# Patient Record
Sex: Female | Born: 1965 | Race: White | Hispanic: No | Marital: Married | Smoking: Never smoker
Health system: Southern US, Community
[De-identification: ages and names within clinical notes are randomized; demographics above are authoritative.]

## PROBLEM LIST (undated history)

## (undated) DIAGNOSIS — R638 Other symptoms and signs concerning food and fluid intake: Secondary | ICD-10-CM

## (undated) DIAGNOSIS — Z87440 Personal history of urinary (tract) infections: Secondary | ICD-10-CM

## (undated) DIAGNOSIS — F329 Major depressive disorder, single episode, unspecified: Secondary | ICD-10-CM

## (undated) DIAGNOSIS — F32A Depression, unspecified: Secondary | ICD-10-CM

## (undated) DIAGNOSIS — N951 Menopausal and female climacteric states: Secondary | ICD-10-CM

## (undated) HISTORY — DX: Other symptoms and signs concerning food and fluid intake: R63.8

## (undated) HISTORY — DX: Personal history of urinary (tract) infections: Z87.440

## (undated) HISTORY — DX: Major depressive disorder, single episode, unspecified: F32.9

## (undated) HISTORY — DX: Depression, unspecified: F32.A

## (undated) HISTORY — DX: Menopausal and female climacteric states: N95.1

---

## 1990-02-10 HISTORY — PX: DILATION AND CURETTAGE OF UTERUS: SHX78

## 1996-02-11 HISTORY — PX: TUBAL LIGATION: SHX77

## 2004-10-16 ENCOUNTER — Ambulatory Visit: Payer: Self-pay | Admitting: Obstetrics and Gynecology

## 2005-10-20 ENCOUNTER — Ambulatory Visit: Payer: Self-pay | Admitting: Obstetrics and Gynecology

## 2006-10-26 ENCOUNTER — Ambulatory Visit: Payer: Self-pay | Admitting: Obstetrics and Gynecology

## 2009-03-29 ENCOUNTER — Ambulatory Visit: Payer: Self-pay | Admitting: Obstetrics and Gynecology

## 2012-06-10 LAB — HM MAMMOGRAPHY

## 2012-06-17 ENCOUNTER — Ambulatory Visit: Payer: Self-pay | Admitting: Obstetrics and Gynecology

## 2013-05-17 LAB — HM COLONOSCOPY

## 2013-05-17 LAB — HM PAP SMEAR: HM Pap smear: NEGATIVE

## 2014-07-26 ENCOUNTER — Encounter: Payer: Self-pay | Admitting: Obstetrics and Gynecology

## 2014-09-26 ENCOUNTER — Encounter: Payer: Self-pay | Admitting: Obstetrics and Gynecology

## 2014-10-26 ENCOUNTER — Other Ambulatory Visit: Payer: Self-pay

## 2014-10-26 MED ORDER — FLUOXETINE HCL 20 MG PO CAPS: 20.0000 mg | ORAL_CAPSULE | Freq: Every day | ORAL | Status: DC

## 2014-11-15 ENCOUNTER — Other Ambulatory Visit: Payer: BLUE CROSS/BLUE SHIELD

## 2014-11-16 ENCOUNTER — Encounter: Payer: Self-pay | Admitting: Obstetrics and Gynecology

## 2014-11-30 ENCOUNTER — Encounter: Payer: Self-pay | Admitting: Obstetrics and Gynecology

## 2014-12-07 ENCOUNTER — Encounter: Payer: Self-pay | Admitting: Obstetrics and Gynecology

## 2014-12-07 ENCOUNTER — Ambulatory Visit (INDEPENDENT_AMBULATORY_CARE_PROVIDER_SITE_OTHER): Payer: BLUE CROSS/BLUE SHIELD | Admitting: Obstetrics and Gynecology

## 2014-12-07 VITALS — BP 112/80 | HR 77 | Ht 66.0 in | Wt 178.3 lb

## 2014-12-07 DIAGNOSIS — E663 Overweight: Secondary | ICD-10-CM | POA: Diagnosis not present

## 2014-12-07 DIAGNOSIS — F329 Major depressive disorder, single episode, unspecified: Secondary | ICD-10-CM | POA: Diagnosis not present

## 2014-12-07 DIAGNOSIS — F3281 Premenstrual dysphoric disorder: Secondary | ICD-10-CM | POA: Diagnosis not present

## 2014-12-07 DIAGNOSIS — F32A Depression, unspecified: Secondary | ICD-10-CM | POA: Insufficient documentation

## 2014-12-07 DIAGNOSIS — Z01419 Encounter for gynecological examination (general) (routine) without abnormal findings: Secondary | ICD-10-CM | POA: Diagnosis not present

## 2014-12-07 DIAGNOSIS — N951 Menopausal and female climacteric states: Secondary | ICD-10-CM | POA: Diagnosis not present

## 2014-12-07 DIAGNOSIS — Z1211 Encounter for screening for malignant neoplasm of colon: Secondary | ICD-10-CM

## 2014-12-07 HISTORY — DX: Premenstrual dysphoric disorder: F32.81

## 2014-12-07 MED ORDER — FLUOXETINE HCL 20 MG PO CAPS: 20.0000 mg | ORAL_CAPSULE | Freq: Every day | ORAL | Status: DC

## 2014-12-07 NOTE — Addendum Note (Signed)
Addended by: Jackquline DenmarkIDGEWAY, Shadana Pry W on: 12/07/2014 12:50 PM   Modules accepted: Orders

## 2014-12-07 NOTE — Progress Notes (Signed)
Patient ID: Michelle Dunlap, female   DOB: 14-Feb-1965, 49 y.o.   MRN: [id number] ANNUAL PREVENTATIVE CARE GYN  ENCOUNTER NOTE  Subjective:       Michelle Dunlap is a 49 y.o. No obstetric history on file. female here for a routine annual gynecologic exam.  Current complaints: 1.  Annual exam 2.  Mild hot flashes/night sweats 3.  Occasionally 2 periods/month, otherwise regular cycles, 4-5 days of bleeding 4.  Refill of fluoxetine - current dosage well controlling anxiety/depression, no side effects   Gynecologic History Patient's last menstrual period was 11/14/2014 (approximate). Contraception: BTL Last Pap: 05/17/13 neg/neg. Last mammogram: 06/10/2012 [facility] 1 2 children Monogamous relationship, married. No hx of STDs, abnormal pap smears  Obstetric History OB History  No data available    Past Medical History  Diagnosis Date  . History of UTI   . Depression     03/2000- stable on meds- 01/2014- fluoxetine rx effective  qd  . Perimenopausal   . Increased BMI     Past Surgical History  Procedure Laterality Date  . Tubal ligation  1998  . Dilation and curettage of uterus  1992    missed ab    Current Outpatient Prescriptions on File Prior to Visit  Medication Sig Dispense Refill  . Ascorbic Acid (VITAMIN C) 100 MG tablet Take 100 mg by mouth daily.    . calcium carbonate (OS-CAL) 600 MG TABS tablet Take 600 mg by mouth 2 (two) times daily with a meal.    . cholecalciferol (VITAMIN D) 1000 UNITS tablet Take 1,000 Units by mouth daily.    Marland Kitchen FLUoxetine (PROZAC) 20 MG capsule Take 1 capsule (20 mg total) by mouth daily. 90 capsule 0  . thiamine (VITAMIN B-1) 100 MG tablet Take 100 mg by mouth daily.     No current facility-administered medications on file prior to visit.    No Known Allergies  Social History   Social History  . Marital Status: Married    Spouse Name: N/A  . Number of Children: N/A  . Years of Education: N/A   Occupational History  . Not on file.    Social History Main Topics  . Smoking status: Never Smoker   . Smokeless tobacco: Not on file  . Alcohol Use: No  . Drug Use: No  . Sexual Activity: Yes    Birth Control/ Protection: Surgical   Other Topics Concern  . Not on file   Social History Narrative    Family History  Problem Relation Age of Onset  . Mental retardation Sister     passed away 29  . Cancer Neg Hx   . Diabetes Neg Hx   . Heart disease Neg Hx     The following portions of the patient's history were reviewed and updated as appropriate: allergies, current medications, past family history, past medical history, past social history, past surgical history and problem list.  Review of Systems ROS Review of Systems - General ROS: negative for - chills, fatigue, fever, weight gain or weight loss. Positive for hot flashes, night sweats Psychological ROS: negative for - anxiety, decreased libido, depression, mood swings Ophthalmic ROS: negative for - blurry vision, eye pain or loss of vision ENT ROS: negative for - headaches, visual changes Hematological and Lymphatic ROS: negative for - bleeding problems, bruising, swollen lymph nodes or weight loss Endocrine ROS: negative for - galactorrhea, hair pattern changes, hot flashes, malaise/lethargy Breast ROS: negative for - new or changing breast lumps or  nipple discharge Respiratory ROS: negative for - cough or shortness of breath Cardiovascular ROS: negative for - chest pain, irregular heartbeat, palpitations or shortness of breath Gastrointestinal ROS: no abdominal pain, change in bowel habits, or black or bloody stools Genito-Urinary ROS: no dysuria, trouble voiding, or hematuria Neurological ROS: negative for - bowel and bladder control changes Dermatological ROS: negative for rash and skin lesion changes   Objective:   BP 112/80 mmHg  Pulse 77  Ht 5\' 6"  (1.676 m)  Wt 178 lb 5 oz (80.882 kg)  BMI 28.79 kg/m2  LMP 11/14/2014  (Approximate) CONSTITUTIONAL: Well-developed, well-nourished female in no acute distress.  PSYCHIATRIC: Normal mood and affect. Normal behavior. Normal judgment and thought content. NEUROLGIC: Alert and oriented to person, place, and time. Normal muscle tone coordination. No cranial nerve deficit noted. HENT:  Normocephalic, atraumatic EYES: Conjunctivae and EOM are normal.  NECK: Normal range of motion, supple, no masses.  Normal thyroid.  SKIN: Skin is warm and dry. No rash noted. Scattered nevi, cherry angiomas. CARDIOVASCULAR: Normal heart rate noted, regular rhythm, no murmur. RESPIRATORY: Clear to auscultation bilaterally. BREASTS: Symmetric in size. No masses, skin changes, nipple drainage, or lymphadenopathy. ABDOMEN: Soft, normal bowel sounds, no distention noted.  No tenderness, rebound or guarding.  BLADDER: Normal PELVIC:  External Genitalia: Normal  BUS: Normal  Vagina: Normal  Cervix: Ectropian cervix, mild mucous discharge  Uterus: Normal size, mobile, Midplane, nontender  Adnexa: Normal  RV: External Exam NormaI, No Rectal Masses and Normal Sphincter tone  MUSCULOSKELETAL: Normal range of motion. No tenderness.  No cyanosis, clubbing, or edema.  2+ distal pulses. LYMPHATIC: No Axillary, Supraclavicular, or Inguinal Adenopathy.    Assessment:   Annual gynecologic examination 49 y.o. Contraception: BTL BMI: 28.8   (BMI:29.1 in 2015) Colon cancer screening Perimenopausal Vasomotor symptoms, Mild PMDD/Depression - well controlled On Prozac; Desires to continue   Plan:  Pap: not needed until 2018 Mammogram: ORDERED Stool Guaiac Testing:  ORDERED.  Epi Pro Colon test ordered for colon cancer screening as patient declined colonoscopy Labs: DECLINES - seeing new PCP this week Routine preventative health maintenance measures emphasized: Exercise/Diet/Weight control, Tobacco Warnings and Alcohol/Substance use risks.  Continue vitamin D/calcium for bone health Refill  fluoxetine for vasomotor symptoms, depression, anxiety. Patient well controlled on current dose.  Return to Clinic - 1 Year   Fenton MallingDebbie Ridgeway, LPN Doreene NestElena Klaus, PA-S Herold HarmsMartin A Michaelle Bottomley, MD    I have seen, interviewed, and examined the patient in conjunction with the [employer] and affirm the diagnosis and management plan. Makenze Ellett A. Jarrod Bodkins, MD, FACOG   Note: This dictation was prepared with Dragon dictation along with smaller phrase technology. Any transcriptional errors that result from this process are unintentional.

## 2014-12-07 NOTE — Patient Instructions (Signed)
1. No pap smear needed until 2018 2. Mammogram ordered to be done this year 3. Stool guaiac cards given today for colon cancer screening 4. Epi Pro Colon blood test ordered for colon cancer screening 5. Routine preventantive health maintenance measures emphasized including: exercise/diet, weight control, tobacco avoidance, alcohol/substance abuse avoidance 6. Continue vitamin D and calcium supplements for bone health maintenance 7. Continue Prozac at current dose 8. Return to clinic in 1 year for routine gynecologic follow up

## 2014-12-19 LAB — EPI PROCOLON(R), SEPTIN 9

## 2015-12-11 ENCOUNTER — Encounter: Payer: BLUE CROSS/BLUE SHIELD | Admitting: Obstetrics and Gynecology

## 2015-12-23 ENCOUNTER — Other Ambulatory Visit: Payer: Self-pay | Admitting: Obstetrics and Gynecology

## 2016-01-22 ENCOUNTER — Other Ambulatory Visit: Payer: Self-pay

## 2016-02-06 ENCOUNTER — Other Ambulatory Visit: Payer: Self-pay | Admitting: Obstetrics and Gynecology

## 2016-02-14 ENCOUNTER — Other Ambulatory Visit: Payer: Self-pay | Admitting: Obstetrics and Gynecology

## 2016-03-14 NOTE — Progress Notes (Signed)
Patient ID: Michelle Dunlap, female   DOB: 1965-07-28, 51 y.o.   MRN: [id number] ANNUAL PREVENTATIVE CARE GYN  ENCOUNTER NOTE  Subjective:       Michelle Dunlap is a 51 y.o. G3 P81  female here for a routine annual gynecologic exam.  Current complaints: 1.  Annual exam 2.  Refill of fluoxetine - wants to increase dosage  no side effects  Patient reports increased stressors; father passed away early 02-03-2018and son is getting married this year. She temporarily increased her fluoxetine dose around the time of her father's passing on. She is not having any significant side effects with the medication.  Patient has gradually lost weight over the year through healthy eating and exercise   Gynecologic History No LMP recorded. Contraception: BTL Last Pap: 05/17/13 neg/neg. Last mammogram: 06/10/2012 [facility] 1 2 children Monogamous relationship, married. No hx of STDs, abnormal pap smears  Obstetric History OB History  No data available    Past Medical History:  Diagnosis Date  . Depression    03/2000- stable on meds- 01/2014- fluoxetine rx effective 20mg  qd  . History of UTI   . Increased BMI   . Perimenopausal     Past Surgical History:  Procedure Laterality Date  . DILATION AND CURETTAGE OF UTERUS  1992   missed ab  . TUBAL LIGATION  1998    Current Outpatient Prescriptions on File Prior to Visit  Medication Sig Dispense Refill  . Ascorbic Acid (VITAMIN C) 100 MG tablet Take 100 mg by mouth daily.    . Biotin 10 MG CAPS Take by mouth daily.    . calcium carbonate (OS-CAL) 600 MG TABS tablet Take 600 mg by mouth 2 (two) times daily with a meal.    . cholecalciferol (VITAMIN D) 1000 UNITS tablet Take 1,000 Units by mouth daily.    Marland Kitchen FLUoxetine (PROZAC) 20 MG capsule TAKE 1 CAPSULE (20 MG TOTAL) BY MOUTH DAILY. 30 capsule 0  . thiamine (VITAMIN B-1) 100 MG tablet Take 100 mg by mouth daily.     No current facility-administered medications on file prior to visit.     No  Known Allergies  Social History   Social History  . Marital status: Married    Spouse name: N/A  . Number of children: N/A  . Years of education: N/A   Occupational History  . Not on file.   Social History Main Topics  . Smoking status: Never Smoker  . Smokeless tobacco: Not on file  . Alcohol use No  . Drug use: No  . Sexual activity: Yes    Birth control/ protection: Surgical   Other Topics Concern  . Not on file   Social History Narrative  . No narrative on file    Family History  Problem Relation Age of Onset  . Mental retardation Sister     passed away 16  . Cancer Neg Hx   . Diabetes Neg Hx   . Heart disease Neg Hx     The following portions of the patient's history were reviewed and updated as appropriate: allergies, current medications, past family history, past medical history, past social history, past surgical history and problem list.  Review of Systems ROS Review of Systems - General ROS: negative for - chills, fatigue, fever, weight gain or weight loss. Positive for hot flashes, night sweats Psychological ROS: negative for - anxiety, decreased libido, depression, mood swings Ophthalmic ROS: negative for - blurry vision, eye pain or loss  of vision ENT ROS: negative for - headaches, visual changes Hematological and Lymphatic ROS: negative for - bleeding problems, bruising, swollen lymph nodes or weight loss Endocrine ROS: negative for - galactorrhea, hair pattern changes, hot flashes, malaise/lethargy Breast ROS: negative for - new or changing breast lumps or nipple discharge Respiratory ROS: negative for - cough or shortness of breath Cardiovascular ROS: negative for - chest pain, irregular heartbeat, palpitations or shortness of breath Gastrointestinal ROS: no abdominal pain, change in bowel habits, or black or bloody stools Genito-Urinary ROS: no dysuria, trouble voiding, or hematuria Neurological ROS: negative for - bowel and bladder control  changes Dermatological ROS: negative for rash and skin lesion changes   Objective:   BP 136/73   Pulse 77   Ht 5\' 6"  (1.676 m)   Wt 171 lb 8 oz (77.8 kg)   LMP 03/12/2015 (Exact Date)   BMI 27.68 kg/m   CONSTITUTIONAL: Well-developed, well-nourished female in no acute distress.  PSYCHIATRIC: Normal mood and affect. Normal behavior. Normal judgment and thought content. NEUROLGIC: Alert and oriented to person, place, and time. Normal muscle tone coordination. No cranial nerve deficit noted. HENT:  Normocephalic, atraumatic EYES: Conjunctivae and EOM are normal.  NECK: Normal range of motion, supple, no masses.  Normal thyroid.  SKIN: Skin is warm and dry. No rash noted. Scattered nevi, cherry angiomas. CARDIOVASCULAR: Normal heart rate noted, regular rhythm, no murmur. RESPIRATORY: Clear to auscultation bilaterally. BREASTS: Symmetric in size. No masses, skin changes, nipple drainage, or lymphadenopathy. ABDOMEN: Soft, normal bowel sounds, no distention noted.  No tenderness, rebound or guarding.  BLADDER: Normal PELVIC:  External Genitalia: Normal  BUS: Normal  Vagina: Normal  Cervix: Ectropian cervix, mild mucous discharge; mild cervical motion tenderness  Uterus: Mid plane to retroverted; 2/4 tender; mobile; top normal size  Adnexa: Normal  RV: External Exam NormaI, No Rectal Masses and Normal Sphincter tone  MUSCULOSKELETAL: Normal range of motion. No tenderness.  No cyanosis, clubbing, or edema.  2+ distal pulses. LYMPHATIC: No Axillary, Supraclavicular, or Inguinal Adenopathy.    Assessment:   Annual gynecologic examination 51 y.o. Contraception: BTL BMI: 27   (BMI:29.1 in 2015) Colon cancer screening Perimenopausal PMDD/Depression - Increased anxiety symptoms; requests dosage increase due to life stressors Pelvic tenderness on exam, unclear etiology-uterus retroverted, mobile, tender   Plan:  Pap: pap w/hpv Mammogram: ORDERED Stool Guaiac Testing:  ORDERED.   Epi Pro Colon test - 2016- neg Labs: lipid vit d tsh a1c fbs Routine preventative health maintenance measures emphasized: Exercise/Diet/Weight control, Tobacco Warnings and Alcohol/Substance use risks.  Pelvic ultrasound; results will be made available Increase fluoxetine 30 mg a day; patient will notify us regarding symptoms after 6 weeks of treatment Continue vitamin D/calcium for bone health  Return to Clinic - 1 [address]Year   Crystal WhaleyvilleMiller, CMA  Herold HarmsMartin A Geanie Pacifico, MD     I have seen, interviewed, and examined the patient in conjunction with the [employer] [employer] and affirm the diagnosis and management plan. Dallen Bunte A. Corbett Moulder, MD, FACOG   Note: This dictation was prepared with Dragon dictation along with smaller phrase technology. Any transcriptional errors that result from this process are unintentional.

## 2016-03-18 ENCOUNTER — Encounter: Payer: Self-pay | Admitting: Obstetrics and Gynecology

## 2016-03-18 ENCOUNTER — Ambulatory Visit (INDEPENDENT_AMBULATORY_CARE_PROVIDER_SITE_OTHER): Payer: BLUE CROSS/BLUE SHIELD | Admitting: Obstetrics and Gynecology

## 2016-03-18 ENCOUNTER — Ambulatory Visit (INDEPENDENT_AMBULATORY_CARE_PROVIDER_SITE_OTHER): Payer: BLUE CROSS/BLUE SHIELD

## 2016-03-18 VITALS — BP 136/73 | HR 77 | Ht 66.0 in | Wt 171.5 lb

## 2016-03-18 DIAGNOSIS — Z1231 Encounter for screening mammogram for malignant neoplasm of breast: Secondary | ICD-10-CM

## 2016-03-18 DIAGNOSIS — Z1239 Encounter for other screening for malignant neoplasm of breast: Secondary | ICD-10-CM

## 2016-03-18 DIAGNOSIS — R102 Pelvic and perineal pain: Secondary | ICD-10-CM

## 2016-03-18 DIAGNOSIS — N951 Menopausal and female climacteric states: Secondary | ICD-10-CM

## 2016-03-18 DIAGNOSIS — F3281 Premenstrual dysphoric disorder: Secondary | ICD-10-CM

## 2016-03-18 DIAGNOSIS — Z1211 Encounter for screening for malignant neoplasm of colon: Secondary | ICD-10-CM

## 2016-03-18 DIAGNOSIS — Z01419 Encounter for gynecological examination (general) (routine) without abnormal findings: Secondary | ICD-10-CM

## 2016-03-18 DIAGNOSIS — E663 Overweight: Secondary | ICD-10-CM

## 2016-03-18 MED ORDER — FLUOXETINE HCL 10 MG PO CAPS: 10.0000 mg | ORAL_CAPSULE | Freq: Every day | ORAL | 3 refills | Status: DC

## 2016-03-18 MED ORDER — FLUOXETINE HCL 20 MG PO CAPS: 20.0000 mg | ORAL_CAPSULE | Freq: Every day | ORAL | 3 refills | Status: DC

## 2016-03-18 NOTE — Patient Instructions (Signed)
1. Pap smear is done 2. Mammogram is ordered 3. Stool guaiac cards are given 4. Screening labs are obtained 5. Pelvic ultrasound is scheduled for evaluation of the pain 6. Continue with calcium and vitamin D supplementation 7. Increase fluoxetine to 30 mg a day; patient will contact us regarding symptomatology in 6 weeks 8. Return in 1 year for physical  Health Maintenance, Female Introduction Adopting a healthy lifestyle and getting preventive care can go a long way to promote health and wellness. Talk with your health care provider about what schedule of regular examinations is right for you. This is a good chance for you to check in with your provider about disease prevention and staying healthy. In between checkups, there are plenty of things you can do on your own. Experts have done a lot of research about which lifestyle changes and preventive measures are most likely to keep you healthy. Ask your health care provider for more information. Weight and diet Eat a healthy diet  Be sure to include plenty of vegetables, fruits, low-fat dairy products, and lean protein.  Do not eat a lot of foods high in solid fats, added sugars, or salt.  Get regular exercise. This is one of the most important things you can do for your health.  Most adults should exercise for at least 150 minutes each week. The exercise should increase your heart rate and make you sweat (moderate-intensity exercise).  Most adults should also do strengthening exercises at least twice a week. This is in addition to the moderate-intensity exercise. Maintain a healthy weight  Body mass index (BMI) is a measurement that can be used to identify possible weight problems. It estimates body fat based on height and weight. Your health care provider can help determine your BMI and help you achieve or maintain a healthy weight.  For females 80 years of age and older:  A BMI below 18.5 is considered underweight.  A BMI of 18.5  to 24.9 is normal.  A BMI of 25 to 29.9 is considered overweight.  A BMI of 30 and above is considered obese. Watch levels of cholesterol and blood lipids  You should start having your blood tested for lipids and cholesterol at 53 years of age, then have this test every 5 years.  You may need to have your cholesterol levels checked more often if:  Your lipid or cholesterol levels are high.  You are older than 53 years of age.  You are at high risk for heart disease. Cancer screening Lung Cancer  Lung cancer screening is recommended for adults 80-77 years old who are at high risk for lung cancer because of a history of smoking.  A yearly low-dose CT scan of the lungs is recommended for people who:  Currently smoke.  Have quit within the past 15 years.  Have at least a 30-pack-year history of smoking. A pack year is smoking an average of one pack of cigarettes a day for 1 year.  Yearly screening should continue until it has been 15 years since you quit.  Yearly screening should stop if you develop a health problem that would prevent you from having lung cancer treatment. Breast Cancer  Practice breast self-awareness. This means understanding how your breasts normally appear and feel.  It also means doing regular breast self-exams. Let your health care provider know about any changes, no matter how small.  If you are in your 20s or 30s, you should have a clinical breast exam (CBE) by a  health care provider every 1-3 years as part of a regular health exam.  If you are 20 or older, have a CBE every year. Also consider having a breast X-ray (mammogram) every year.  If you have a family history of breast cancer, talk to your health care provider about genetic screening.  If you are at high risk for breast cancer, talk to your health care provider about having an MRI and a mammogram every year.  Breast cancer gene (BRCA) assessment is recommended for women who have family  members with BRCA-related cancers. BRCA-related cancers include:  Breast.  Ovarian.  Tubal.  Peritoneal cancers.  Results of the assessment will determine the need for genetic counseling and BRCA1 and BRCA2 testing. Cervical Cancer  Your health care provider may recommend that you be screened regularly for cancer of the pelvic organs (ovaries, uterus, and vagina). This screening involves a pelvic examination, including checking for microscopic changes to the surface of your cervix (Pap test). You may be encouraged to have this screening done every 3 years, beginning at age 49.  For women ages 18-65, health care providers may recommend pelvic exams and Pap testing every 3 years, or they may recommend the Pap and pelvic exam, combined with testing for human papilloma virus (HPV), every 5 years. Some types of HPV increase your risk of cervical cancer. Testing for HPV may also be done on women of any age with unclear Pap test results.  Other health care providers may not recommend any screening for nonpregnant women who are considered low risk for pelvic cancer and who do not have symptoms. Ask your health care provider if a screening pelvic exam is right for you.  If you have had past treatment for cervical cancer or a condition that could lead to cancer, you need Pap tests and screening for cancer for at least 20 years after your treatment. If Pap tests have been discontinued, your risk factors (such as having a new sexual partner) need to be reassessed to determine if screening should resume. Some women have medical problems that increase the chance of getting cervical cancer. In these cases, your health care provider may recommend more frequent screening and Pap tests. Colorectal Cancer  This type of cancer can be detected and often prevented.  Routine colorectal cancer screening usually begins at 53 years of age and continues through 53 years of age.  Your health care provider may recommend  screening at an earlier age if you have risk factors for colon cancer.  Your health care provider may also recommend using home test kits to check for hidden blood in the stool.  A small camera at the end of a tube can be used to examine your colon directly (sigmoidoscopy or colonoscopy). This is done to check for the earliest forms of colorectal cancer.  Routine screening usually begins at age 34.  Direct examination of the colon should be repeated every 5-10 years through 53 years of age. However, you may need to be screened more often if early forms of precancerous polyps or small growths are found. Skin Cancer  Check your skin from head to toe regularly.  Tell your health care provider about any new moles or changes in moles, especially if there is a change in a mole's shape or color.  Also tell your health care provider if you have a mole that is larger than the size of a pencil eraser.  Always use sunscreen. Apply sunscreen liberally and repeatedly throughout the day.  Protect yourself by wearing long sleeves, pants, a wide-brimmed hat, and sunglasses whenever you are outside. Heart disease, diabetes, and high blood pressure  High blood pressure causes heart disease and increases the risk of stroke. High blood pressure is more likely to develop in:  People who have blood pressure in the high end of the normal range (130-139/85-89 mm Hg).  People who are overweight or obese.  People who are African American.  If you are 47-72 years of age, have your blood pressure checked every 3-5 years. If you are 64 years of age or older, have your blood pressure checked every year. You should have your blood pressure measured twice-once when you are at a hospital or clinic, and once when you are not at a hospital or clinic. Record the average of the two measurements. To check your blood pressure when you are not at a hospital or clinic, you can use:  An automated blood pressure machine at a  pharmacy.  A home blood pressure monitor.  If you are between 27 years and 91 years old, ask your health care provider if you should take aspirin to prevent strokes.  Have regular diabetes screenings. This involves taking a blood sample to check your fasting blood sugar level.  If you are at a normal weight and have a low risk for diabetes, have this test once every three years after 53 years of age.  If you are overweight and have a high risk for diabetes, consider being tested at a younger age or more often. Preventing infection Hepatitis B  If you have a higher risk for hepatitis B, you should be screened for this virus. You are considered at high risk for hepatitis B if:  You were born in a country where hepatitis B is common. Ask your health care provider which countries are considered high risk.  Your parents were born in a high-risk country, and you have not been immunized against hepatitis B (hepatitis B vaccine).  You have HIV or AIDS.  You use needles to inject street drugs.  You live with someone who has hepatitis B.  You have had sex with someone who has hepatitis B.  You get hemodialysis treatment.  You take certain medicines for conditions, including cancer, organ transplantation, and autoimmune conditions. Hepatitis C  Blood testing is recommended for:  Everyone born from [birth year] through 1967.  Anyone with known risk factors for hepatitis C. Sexually transmitted infections (STIs)  You should be screened for sexually transmitted infections (STIs) including gonorrhea and chlamydia if:  You are sexually active and are younger than 53 years of age.  You are older than 53 years of age and your health care provider tells you that you are at risk for this type of infection.  Your sexual activity has changed since you were last screened and you are at an increased risk for chlamydia or gonorrhea. Ask your health care provider if you are at risk.  If you do not have  HIV, but are at risk, it may be recommended that you take a prescription medicine daily to prevent HIV infection. This is called pre-exposure prophylaxis (PrEP). You are considered at risk if:  You are sexually active and do not regularly use condoms or know the HIV status of your partner(s).  You take drugs by injection.  You are sexually active with a partner who has HIV. Talk with your health care provider about whether you are at high risk of being infected with HIV.  If you choose to begin PrEP, you should first be tested for HIV. You should then be tested every 3 months for as long as you are taking PrEP. Pregnancy  If you are premenopausal and you may become pregnant, ask your health care provider about preconception counseling.  If you may become pregnant, take 400 to 800 micrograms (mcg) of folic acid every day.  If you want to prevent pregnancy, talk to your health care provider about birth control (contraception). Osteoporosis and menopause  Osteoporosis is a disease in which the bones lose minerals and strength with aging. This can result in serious bone fractures. Your risk for osteoporosis can be identified using a bone density scan.  If you are 81 years of age or older, or if you are at risk for osteoporosis and fractures, ask your health care provider if you should be screened.  Ask your health care provider whether you should take a calcium or vitamin D supplement to lower your risk for osteoporosis.  Menopause may have certain physical symptoms and risks.  Hormone replacement therapy may reduce some of these symptoms and risks. Talk to your health care provider about whether hormone replacement therapy is right for you. Follow these instructions at home:  Schedule regular health, dental, and eye exams.  Stay current with your immunizations.  Do not use any tobacco products including cigarettes, chewing tobacco, or electronic cigarettes.  If you are pregnant, do not  drink alcohol.  If you are breastfeeding, limit how much and how often you drink alcohol.  Limit alcohol intake to no more than 1 drink per day for nonpregnant women. One drink equals 12 ounces of beer, 5 ounces of wine, or 1 ounces of hard liquor.  Do not use street drugs.  Do not share needles.  Ask your health care provider for help if you need support or information about quitting drugs.  Tell your health care provider if you often feel depressed.  Tell your health care provider if you have ever been abused or do not feel safe at home. This information is not intended to replace advice given to you by your health care provider. Make sure you discuss any questions you have with your health care provider. Document Released: 08/12/2010 Document Revised: 07/05/2015 Document Reviewed: 10/31/2014  2017 [employer]

## 2016-03-19 LAB — TSH: TSH: 1.92 u[IU]/mL (ref 0.450–4.500)

## 2016-03-19 LAB — GLUCOSE, RANDOM: GLUCOSE: 97 mg/dL (ref 65–99)

## 2016-03-19 LAB — LIPID PANEL
CHOLESTEROL TOTAL: 190 mg/dL (ref 100–199)
Chol/HDL Ratio: 3.7 ratio units (ref 0.0–4.4)
HDL: 52 mg/dL (ref >39)
LDL Calculated: 108 mg/dL — ABNORMAL HIGH (ref 0–99)
Triglycerides: 150 mg/dL — ABNORMAL HIGH (ref 0–149)
VLDL CHOLESTEROL CAL: 30 mg/dL (ref 5–40)

## 2016-03-19 LAB — HEMOGLOBIN A1C
ESTIMATED AVERAGE GLUCOSE: 108 mg/dL
Hgb A1c MFr Bld: 5.4 % (ref 4.8–5.6)

## 2016-03-19 LAB — VITAMIN D 25 HYDROXY (VIT D DEFICIENCY, FRACTURES): VIT D 25 HYDROXY: 32.4 ng/mL (ref 30.0–100.0)

## 2016-03-22 LAB — PAP IG AND HPV HIGH-RISK
HPV, high-risk: NEGATIVE
PAP Smear Comment: 0

## 2017-01-21 ENCOUNTER — Ambulatory Visit
Admission: RE | Admit: 2017-01-21 | Discharge: 2017-01-21 | Disposition: A | Discharge status: Home / Self Care | Payer: BLUE CROSS/BLUE SHIELD | Source: Ambulatory Visit | Attending: Obstetrics and Gynecology | Admitting: Obstetrics and Gynecology

## 2017-01-21 DIAGNOSIS — Z1239 Encounter for other screening for malignant neoplasm of breast: Secondary | ICD-10-CM

## 2017-01-21 DIAGNOSIS — Z1231 Encounter for screening mammogram for malignant neoplasm of breast: Secondary | ICD-10-CM | POA: Insufficient documentation

## 2017-03-10 ENCOUNTER — Other Ambulatory Visit: Payer: Self-pay

## 2017-03-10 MED ORDER — FLUOXETINE HCL 10 MG PO CAPS: 10.0000 mg | ORAL_CAPSULE | Freq: Every day | ORAL | 3 refills | Status: DC

## 2017-03-10 MED ORDER — FLUOXETINE HCL 20 MG PO CAPS: 20.0000 mg | ORAL_CAPSULE | Freq: Every day | ORAL | 3 refills | Status: DC

## 2017-03-20 NOTE — Progress Notes (Deleted)
Patient ID: Michelle Dunlap, female   DOB: 1965-11-28, 52 y.o.   MRN: [id number] ANNUAL PREVENTATIVE CARE GYN  ENCOUNTER NOTE  Subjective:       Michelle Dunlap is a 52 y.o. G3 P59  female here for a routine annual gynecologic exam.  Current complaints: 1.  Annual exam 2.  Refill of fluoxetine -  Patient reports increased stressors; father passed away early Jan 13, 2018and son is getting married this year. She temporarily increased her fluoxetine dose around the time of her father's passing on. She is not having any significant side effects with the medication.  Patient has gradually lost weight over the year through healthy eating and exercise   Gynecologic History No LMP recorded. Contraception: BTL Last Pap: 03/18/2016 neg/neg. Last mammogram: 01/22/2017 [facility] 1 2 children Monogamous relationship, married. No hx of STDs, abnormal pap smears  Obstetric History OB History  Gravida Para Term Preterm AB Living  3 2 2   1 2   SAB TAB Ectopic Multiple Live Births  1       2    # Outcome Date GA Lbr Len/2nd Weight Sex Delivery Anes PTL Lv  3 Term 1997   8 lb 14.4 oz (4.037 kg) M Vag-Spont   LIV  2 Term 1993   8 lb 14.4 oz (4.037 kg) M Vag-Spont   LIV  1 SAB 1992              Past Medical History:  Diagnosis Date  . Depression    03/2000- stable on meds- 01/2014- fluoxetine rx effective 20mg  qd  . History of UTI   . Increased BMI   . Perimenopausal     Past Surgical History:  Procedure Laterality Date  . DILATION AND CURETTAGE OF UTERUS  1992   missed ab  . TUBAL LIGATION  1998    Current Outpatient Medications on File Prior to Visit  Medication Sig Dispense Refill  . Ascorbic Acid (VITAMIN C) 100 MG tablet Take 100 mg by mouth daily.    . Biotin 10 MG CAPS Take by mouth daily.    . calcium carbonate (OS-CAL) 600 MG TABS tablet Take 600 mg by mouth 2 (two) times daily with a meal.    . cholecalciferol (VITAMIN D) 1000 UNITS tablet Take 1,000 Units by mouth daily.    Marland Kitchen  FLUoxetine (PROZAC) 10 MG capsule Take 1 capsule (10 mg total) by mouth daily. 90 capsule 3  . FLUoxetine (PROZAC) 20 MG capsule Take 1 capsule (20 mg total) by mouth daily. 90 capsule 3  . thiamine (VITAMIN B-1) 100 MG tablet Take 100 mg by mouth daily.     No current facility-administered medications on file prior to visit.     No Known Allergies  Social History   Socioeconomic History  . Marital status: Married    Spouse name: Not on file  . Number of children: Not on file  . Years of education: Not on file  . Highest education level: Not on file  Social Needs  . Financial resource strain: Not on file  . Food insecurity - worry: Not on file  . Food insecurity - inability: Not on file  . Transportation needs - medical: Not on file  . Transportation needs - non-medical: Not on file  Occupational History  . Not on file  Tobacco Use  . Smoking status: Never Smoker  . Smokeless tobacco: Never Used  Substance and Sexual Activity  . Alcohol use: No  .  Drug use: No  . Sexual activity: Not Currently    Birth control/protection: Surgical  Other Topics Concern  . Not on file  Social History Narrative  . Not on file    Family History  Problem Relation Age of Onset  . Mental retardation Sister        passed away [id number]  . Cancer Neg Hx   . Diabetes Neg Hx   . Heart disease Neg Hx     The following portions of the patient's history were reviewed and updated as appropriate: allergies, current medications, past family history, past medical history, past social history, past surgical history and problem list.  Review of Systems ROS  Objective:   There were no vitals taken for this visit.  CONSTITUTIONAL: Well-developed, well-nourished female in no acute distress.  PSYCHIATRIC: Normal mood and affect. Normal behavior. Normal judgment and thought content. NEUROLGIC: Alert and oriented to person, place, and time. Normal muscle tone coordination. No cranial nerve deficit  noted. HENT:  Normocephalic, atraumatic EYES: Conjunctivae and EOM are normal.  NECK: Normal range of motion, supple, no masses.  Normal thyroid.  SKIN: Skin is warm and dry. No rash noted. Scattered nevi, cherry angiomas. CARDIOVASCULAR: Normal heart rate noted, regular rhythm, no murmur. RESPIRATORY: Clear to auscultation bilaterally. BREASTS: Symmetric in size. No masses, skin changes, nipple drainage, or lymphadenopathy. ABDOMEN: Soft, normal bowel sounds, no distention noted.  No tenderness, rebound or guarding.  BLADDER: Normal PELVIC:  External Genitalia: Normal  BUS: Normal  Vagina: Normal  Cervix: Ectropian cervix, mild mucous discharge; mild cervical motion tenderness  Uterus: Mid plane to retroverted; 2/4 tender; mobile; top normal size  Adnexa: Normal  RV: External Exam NormaI, No Rectal Masses and Normal Sphincter tone  MUSCULOSKELETAL: Normal range of motion. No tenderness.  No cyanosis, clubbing, or edema.  2+ distal pulses. LYMPHATIC: No Axillary, Supraclavicular, or Inguinal Adenopathy.    Assessment:   Annual gynecologic examination 52 y.o. Contraception: BTL BMI: 27   (BMI:29.1 in 2015) Colon cancer screening Perimenopausal PMDD/Depression - Increased anxiety symptoms; requests dosage increase due to life stressors Pelvic tenderness on exam, unclear etiology-uterus retroverted, mobile, tender   Plan:  Pap:Due 2021 Mammogram: ORDERED Stool Guaiac Testing:  ORDERED.  Epi Pro Colon test - 2016- neg Labs: lipid vit d tsh a1c fbs Routine preventative health maintenance measures emphasized: Exercise/Diet/Weight control, Tobacco Warnings and Alcohol/Substance use risks.  Increase fluoxetine 30 mg a day; patient will notify us regarding symptoms after 6 weeks of treatment Continue vitamin D/calcium for bone health  Return to Clinic - 1 Year   Othal Kubitz [place], CMA      I have seen, interviewed, and examined the patient in conjunction with the [employer] and affirm the diagnosis and management plan. Martin A. DeFrancesco, MD, FACOG   Note: This dictation was prepared with Dragon dictation along with smaller phrase technology. Any transcriptional errors that result from this process are unintentional.

## 2017-03-24 ENCOUNTER — Encounter: Payer: BLUE CROSS/BLUE SHIELD | Admitting: Obstetrics and Gynecology

## 2017-04-03 NOTE — Progress Notes (Signed)
Patient ID: Michelle Dunlap, female   DOB: Oct 09, 1965, 52 y.o.   MRN: [id number] ANNUAL PREVENTATIVE CARE GYN  ENCOUNTER NOTE  Subjective:       Michelle FishermanSuzanne P Cange is a 52 y.o. G3 40P2012  female here for a routine annual gynecologic exam.  Current complaints: 1.  Annual exam 2.  Refill of fluoxetine -currently taking 30 mg a day with good control of symptoms; no significant side effects.  Patient is willing to do a taper if possible. Michelle Dunlap continues to have irregular cycles with 3 days of bleeding which is heavy associated with cramping.  She is not having intercourse at this time as it is uncomfortable.   Gynecologic History No LMP recorded. Contraception: BTL Last Pap: 03/18/2016 neg/neg. Last mammogram: 01/22/2017 [facility] 1 2 children Monogamous relationship, married. No hx of STDs, abnormal pap smears  Obstetric History OB History  Gravida Para Term Preterm AB Living  3 2 2   1 2   SAB TAB Ectopic Multiple Live Births  1       2    # Outcome Date GA Lbr Len/2nd Weight Sex Delivery Anes PTL Lv  3 Term 1997   8 lb 14.4 oz (4.037 kg) M Vag-Spont   LIV  2 Term 1993   8 lb 14.4 oz (4.037 kg) M Vag-Spont   LIV  1 SAB 1992              Past Medical History:  Diagnosis Date  . Depression    03/2000- stable on meds- 01/2014- fluoxetine rx effective 20mg  qd  . History of UTI   . Increased BMI   . Perimenopausal     Past Surgical History:  Procedure Laterality Date  . DILATION AND CURETTAGE OF UTERUS  1992   missed ab  . TUBAL LIGATION  1998    Current Outpatient Medications on File Prior to Visit  Medication Sig Dispense Refill  . Ascorbic Acid (VITAMIN C) 100 MG tablet Take 100 mg by mouth daily.    . Biotin 10 MG CAPS Take by mouth daily.    . calcium carbonate (OS-CAL) 600 MG TABS tablet Take 600 mg by mouth 2 (two) times daily with a meal.    . cholecalciferol (VITAMIN D) 1000 UNITS tablet Take 1,000 Units by mouth daily.    Marland Kitchen. FLUoxetine (PROZAC) 10 MG capsule Take 1 capsule  (10 mg total) by mouth daily. 90 capsule 3  . FLUoxetine (PROZAC) 20 MG capsule Take 1 capsule (20 mg total) by mouth daily. 90 capsule 3  . thiamine (VITAMIN B-1) 100 MG tablet Take 100 mg by mouth daily.     No current facility-administered medications on file prior to visit.     No Known Allergies  Social History   Socioeconomic History  . Marital status: Married    Spouse name: Not on file  . Number of children: Not on file  . Years of education: Not on file  . Highest education level: Not on file  Social Needs  . Financial resource strain: Not on file  . Food insecurity - worry: Not on file  . Food insecurity - inability: Not on file  . Transportation needs - medical: Not on file  . Transportation needs - non-medical: Not on file  Occupational History  . Not on file  Tobacco Use  . Smoking status: Never Smoker  . Smokeless tobacco: Never Used  Substance and Sexual Activity  . Alcohol use: No  . Drug use: No  .  Sexual activity: Not Currently    Birth control/protection: Surgical  Other Topics Concern  . Not on file  Social History Narrative  . Not on file    Family History  Problem Relation Age of Onset  . Mental retardation Sister        passed away 35  . Cancer Neg Hx   . Diabetes Neg Hx   . Heart disease Neg Hx     The following portions of the patient's history were reviewed and updated as appropriate: allergies, current medications, past family history, past medical history, past social history, past surgical history and problem list.  Review of Systems Review of Systems  Constitutional:       Occasional vasomotor symptom  Eyes: Negative.   Cardiovascular: Negative.   Gastrointestinal: Negative.   Genitourinary: Negative.        Menses heavy and short and irregular with associated dysmenorrhea  Musculoskeletal: Negative.   Skin: Negative.   Neurological: Negative.   Endo/Heme/Allergies: Negative.   Psychiatric/Behavioral: Positive for  depression.       PMDD, stable on fluoxetine 30 mg a day; desires to taper    Objective:   BP 111/76   Pulse 79   Ht 5\' 5"  (1.651 m)   Wt 177 lb 12.8 oz (80.6 kg)   LMP 03/23/2017 (Exact Date)   BMI 29.59 kg/m   CONSTITUTIONAL: Well-developed, well-nourished female in no acute distress.  PSYCHIATRIC: Normal mood and affect. Normal behavior. Normal judgment and thought content. NEUROLGIC: Alert and oriented to person, place, and time. Normal muscle tone coordination. No cranial nerve deficit noted. HENT:  Normocephalic, atraumatic EYES: Conjunctivae and EOM are normal.  NECK: Normal range of motion, supple, no masses.  Normal thyroid.  SKIN: Skin is warm and dry. No rash noted. Scattered nevi, cherry angiomas. CARDIOVASCULAR: Normal heart rate noted, regular rhythm, no murmur. RESPIRATORY: Clear to auscultation bilaterally. BREASTS: Symmetric in size. No masses, skin changes, nipple drainage, or lymphadenopathy. ABDOMEN: Soft, normal bowel sounds, no distention noted.  No tenderness, rebound or guarding.  BLADDER: Normal PELVIC:  External Genitalia: Normal  BUS: Normal  Vagina: Normal; fair estrogen effect  Cervix: Ectropian cervix, mild mucous discharge; moderate cervical motion tenderness  Uterus: Mid plane to retroverted; 2/4 tender; mobile; top normal size  Adnexa: Normal; nonpalpable and nontender  RV: External Exam NormaI, No Rectal Masses and Normal Sphincter tone  MUSCULOSKELETAL: Normal range of motion. No tenderness.  No cyanosis, clubbing, or edema.  2+ distal pulses. LYMPHATIC: No Axillary, Supraclavicular, or Inguinal Adenopathy.    Assessment:   Annual gynecologic examination 52 y.o. Contraception: BTL BMI: 29 Colon cancer screening Perimenopausal PMDD/Depression - Increased anxiety symptoms; requests dosage increase due to life stressors Moderate tenderness on pelvic exam, possibly associated with adenomyosis; ultrasound 1 year ago showed multiple small  fibroids   Plan:  Pap:Due 2021 Mammogram: ORDERED Epi Pro Colon test - 2016- neg-  ordered Labs: lipid tsh a1c fbs Routine preventative health maintenance measures emphasized: Exercise/Diet/Weight control, Tobacco Warnings and Alcohol/Substance use risks.   fluoxetine 30 mg a day reordered; patient to go on a taper of 20 mg a day if tolerated Return in 6 months for follow-up on PMDD/depression Continue vitamin D/calcium for bone health Return to Clinic - [address] [place], C[place]  Herold Harms, MD   Note: This dictation was prepared with Dragon dictation along with smaller phrase technology. Any transcriptional errors that result from this process are unintentional.

## 2017-04-09 ENCOUNTER — Encounter: Payer: Self-pay | Admitting: Obstetrics and Gynecology

## 2017-04-09 ENCOUNTER — Ambulatory Visit (INDEPENDENT_AMBULATORY_CARE_PROVIDER_SITE_OTHER): Payer: BLUE CROSS/BLUE SHIELD | Admitting: Obstetrics and Gynecology

## 2017-04-09 VITALS — BP 111/76 | HR 79 | Ht 65.0 in | Wt 177.8 lb

## 2017-04-09 DIAGNOSIS — Z1211 Encounter for screening for malignant neoplasm of colon: Secondary | ICD-10-CM

## 2017-04-09 DIAGNOSIS — Z01419 Encounter for gynecological examination (general) (routine) without abnormal findings: Secondary | ICD-10-CM | POA: Diagnosis not present

## 2017-04-09 DIAGNOSIS — Z1239 Encounter for other screening for malignant neoplasm of breast: Secondary | ICD-10-CM

## 2017-04-09 DIAGNOSIS — N951 Menopausal and female climacteric states: Secondary | ICD-10-CM

## 2017-04-09 DIAGNOSIS — F3281 Premenstrual dysphoric disorder: Secondary | ICD-10-CM | POA: Diagnosis not present

## 2017-04-09 DIAGNOSIS — E663 Overweight: Secondary | ICD-10-CM | POA: Diagnosis not present

## 2017-04-09 DIAGNOSIS — Z1231 Encounter for screening mammogram for malignant neoplasm of breast: Secondary | ICD-10-CM

## 2017-04-09 MED ORDER — FLUOXETINE HCL 20 MG PO CAPS: 20.0000 mg | ORAL_CAPSULE | Freq: Every day | ORAL | 3 refills | Status: DC

## 2017-04-09 MED ORDER — FLUOXETINE HCL 10 MG PO CAPS: 10.0000 mg | ORAL_CAPSULE | Freq: Every day | ORAL | 3 refills | Status: DC

## 2017-04-09 NOTE — Patient Instructions (Addendum)
1.  No Pap smear done.  Next Pap is due 2021 2.  Epi Pro Colon test - 2016- neg-  Ordered 3.  Mammogram is ordered 4.  Screening labs are ordered 5.  Continue with healthy eating and exercise with control weight loss 6.  Refill fluoxetine 30 mg a day.  Try to decrease doses to 20 mg a day. 7.  Return in 6 months for follow-up on anxiety and fluoxetine 8.  Continue with calcium and vitamin D supplementation daily 9.  Return in 1 year for annual exam   Health Maintenance for Postmenopausal Women Menopause is a normal process in which your reproductive ability comes to an end. This process happens gradually over a span of months to years, usually between the ages of 48 and 62. Menopause is complete when you have missed 12 consecutive menstrual periods. It is important to talk with your health care provider about some of the most common conditions that affect postmenopausal women, such as heart disease, cancer, and bone loss (osteoporosis). Adopting a healthy lifestyle and getting preventive care can help to promote your health and wellness. Those actions can also lower your chances of developing some of these common conditions. What should I know about menopause? During menopause, you may experience a number of symptoms, such as:  Moderate-to-severe hot flashes.  Night sweats.  Decrease in sex drive.  Mood swings.  Headaches.  Tiredness.  Irritability.  Memory problems.  Insomnia.  Choosing to treat or not to treat menopausal changes is an individual decision that you make with your health care provider. What should I know about hormone replacement therapy and supplements? Hormone therapy products are effective for treating symptoms that are associated with menopause, such as hot flashes and night sweats. Hormone replacement carries certain risks, especially as you become older. If you are thinking about using estrogen or estrogen with progestin treatments, discuss the benefits and  risks with your health care provider. What should I know about heart disease and stroke? Heart disease, heart attack, and stroke become more likely as you age. This may be due, in part, to the hormonal changes that your body experiences during menopause. These can affect how your body processes dietary fats, triglycerides, and cholesterol. Heart attack and stroke are both medical emergencies. There are many things that you can do to help prevent heart disease and stroke:  Have your blood pressure checked at least every 1-2 years. High blood pressure causes heart disease and increases the risk of stroke.  If you are 41-65 years old, ask your health care provider if you should take aspirin to prevent a heart attack or a stroke.  Do not use any tobacco products, including cigarettes, chewing tobacco, or electronic cigarettes. If you need help quitting, ask your health care provider.  It is important to eat a healthy diet and maintain a healthy weight. ? Be sure to include plenty of vegetables, fruits, low-fat dairy products, and lean protein. ? Avoid eating foods that are high in solid fats, added sugars, or salt (sodium).  Get regular exercise. This is one of the most important things that you can do for your health. ? Try to exercise for at least 150 minutes each week. The type of exercise that you do should increase your heart rate and make you sweat. This is known as moderate-intensity exercise. ? Try to do strengthening exercises at least twice each week. Do these in addition to the moderate-intensity exercise.  Know your numbers.Ask your health care  provider to check your cholesterol and your blood glucose. Continue to have your blood tested as directed by your health care provider.  What should I know about cancer screening? There are several types of cancer. Take the following steps to reduce your risk and to catch any cancer development as early as possible. Breast Cancer  Practice  breast self-awareness. ? This means understanding how your breasts normally appear and feel. ? It also means doing regular breast self-exams. Let your health care provider know about any changes, no matter how small.  If you are 40 or older, have a clinician do a breast exam (clinical breast exam or CBE) every year. Depending on your age, family history, and medical history, it may be recommended that you also have a yearly breast X-ray (mammogram).  If you have a family history of breast cancer, talk with your health care provider about genetic screening.  If you are at high risk for breast cancer, talk with your health care provider about having an MRI and a mammogram every year.  Breast cancer (BRCA) gene test is recommended for women who have family members with BRCA-related cancers. Results of the assessment will determine the need for genetic counseling and BRCA1 and for BRCA2 testing. BRCA-related cancers include these types: ? Breast. This occurs in males or females. ? Ovarian. ? Tubal. This may also be called fallopian tube cancer. ? Cancer of the abdominal or pelvic lining (peritoneal cancer). ? Prostate. ? Pancreatic.  Cervical, Uterine, and Ovarian Cancer Your health care provider may recommend that you be screened regularly for cancer of the pelvic organs. These include your ovaries, uterus, and vagina. This screening involves a pelvic exam, which includes checking for microscopic changes to the surface of your cervix (Pap test).  For women ages 21-65, health care providers may recommend a pelvic exam and a Pap test every three years. For women ages 30-65, they may recommend the Pap test and pelvic exam, combined with testing for human papilloma virus (HPV), every five years. Some types of HPV increase your risk of cervical cancer. Testing for HPV may also be done on women of any age who have unclear Pap test results.  Other health care providers may not recommend any screening  for nonpregnant women who are considered low risk for pelvic cancer and have no symptoms. Ask your health care provider if a screening pelvic exam is right for you.  If you have had past treatment for cervical cancer or a condition that could lead to cancer, you need Pap tests and screening for cancer for at least 20 years after your treatment. If Pap tests have been discontinued for you, your risk factors (such as having a new sexual partner) need to be reassessed to determine if you should start having screenings again. Some women have medical problems that increase the chance of getting cervical cancer. In these cases, your health care provider may recommend that you have screening and Pap tests more often.  If you have a family history of uterine cancer or ovarian cancer, talk with your health care provider about genetic screening.  If you have vaginal bleeding after reaching menopause, tell your health care provider.  There are currently no reliable tests available to screen for ovarian cancer.  Lung Cancer Lung cancer screening is recommended for adults 55-80 years old who are at high risk for lung cancer because of a history of smoking. A yearly low-dose CT scan of the lungs is recommended if you:    Currently smoke.  Have a history of at least 30 pack-years of smoking and you currently smoke or have quit within the past 15 years. A pack-year is smoking an average of one pack of cigarettes per day for one year.  Yearly screening should:  Continue until it has been 15 years since you quit.  Stop if you develop a health problem that would prevent you from having lung cancer treatment.  Colorectal Cancer  This type of cancer can be detected and can often be prevented.  Routine colorectal cancer screening usually begins at age 59 and continues through age 64.  If you have risk factors for colon cancer, your health care provider may recommend that you be screened at an earlier age.  If  you have a family history of colorectal cancer, talk with your health care provider about genetic screening.  Your health care provider may also recommend using home test kits to check for hidden blood in your stool.  A small camera at the end of a tube can be used to examine your colon directly (sigmoidoscopy or colonoscopy). This is done to check for the earliest forms of colorectal cancer.  Direct examination of the colon should be repeated every 5-10 years until age 39. However, if early forms of precancerous polyps or small growths are found or if you have a family history or genetic risk for colorectal cancer, you may need to be screened more often.  Skin Cancer  Check your skin from head to toe regularly.  Monitor any moles. Be sure to tell your health care provider: ? About any new moles or changes in moles, especially if there is a change in a mole's shape or color. ? If you have a mole that is larger than the size of a pencil eraser.  If any of your family members has a history of skin cancer, especially at a young age, talk with your health care provider about genetic screening.  Always use sunscreen. Apply sunscreen liberally and repeatedly throughout the day.  Whenever you are outside, protect yourself by wearing long sleeves, pants, a wide-brimmed hat, and sunglasses.  What should I know about osteoporosis? Osteoporosis is a condition in which bone destruction happens more quickly than new bone creation. After menopause, you may be at an increased risk for osteoporosis. To help prevent osteoporosis or the bone fractures that can happen because of osteoporosis, the following is recommended:  If you are 48-39 years old, get at least 1,000 mg of calcium and at least 600 mg of vitamin D per day.  If you are older than age 72 but younger than age 67, get at least 1,200 mg of calcium and at least 600 mg of vitamin D per day.  If you are older than age 1, get at least 1,200 mg of  calcium and at least 800 mg of vitamin D per day.  Smoking and excessive alcohol intake increase the risk of osteoporosis. Eat foods that are rich in calcium and vitamin D, and do weight-bearing exercises several times each week as directed by your health care provider. What should I know about how menopause affects my mental health? Depression may occur at any age, but it is more common as you become older. Common symptoms of depression include:  Low or sad mood.  Changes in sleep patterns.  Changes in appetite or eating patterns.  Feeling an overall lack of motivation or enjoyment of activities that you previously enjoyed.  Frequent crying spells.  Talk with your health care provider if you think that you are experiencing depression. What should I know about immunizations? It is important that you get and maintain your immunizations. These include:  Tetanus, diphtheria, and pertussis (Tdap) booster vaccine.  Influenza every year before the flu season begins.  Pneumonia vaccine.  Shingles vaccine.  Your health care provider may also recommend other immunizations. This information is not intended to replace advice given to you by your health care provider. Make sure you discuss any questions you have with your health care provider. Document Released: 03/21/2005 Document Revised: 08/17/2015 Document Reviewed: 10/31/2014 Elsevier Interactive Patient Education  2018 [employer]     

## 2017-04-14 LAB — LIPID PANEL
CHOLESTEROL TOTAL: 207 mg/dL — AB (ref 100–199)
Chol/HDL Ratio: 3.8 ratio (ref 0.0–4.4)
HDL: 54 mg/dL (ref >39)
LDL Calculated: 119 mg/dL — ABNORMAL HIGH (ref 0–99)
TRIGLYCERIDES: 172 mg/dL — AB (ref 0–149)
VLDL Cholesterol Cal: 34 mg/dL (ref 5–40)

## 2017-04-14 LAB — EPI PROCOLON(R), SEPTIN 9

## 2017-04-14 LAB — TSH: TSH: 3.14 u[IU]/mL (ref 0.450–4.500)

## 2017-04-14 LAB — HEMOGLOBIN A1C
ESTIMATED AVERAGE GLUCOSE: 117 mg/dL
HEMOGLOBIN A1C: 5.7 % — AB (ref 4.8–5.6)

## 2017-04-14 LAB — GLUCOSE, RANDOM: Glucose: 83 mg/dL (ref 65–99)

## 2017-10-07 ENCOUNTER — Encounter: Payer: BLUE CROSS/BLUE SHIELD | Admitting: Obstetrics and Gynecology

## 2018-04-14 ENCOUNTER — Encounter: Payer: BLUE CROSS/BLUE SHIELD | Admitting: Obstetrics and Gynecology

## 2018-04-15 ENCOUNTER — Encounter: Payer: BLUE CROSS/BLUE SHIELD | Admitting: Obstetrics and Gynecology

## 2018-05-06 ENCOUNTER — Encounter: Payer: BLUE CROSS/BLUE SHIELD | Admitting: Obstetrics and Gynecology

## 2018-07-09 ENCOUNTER — Encounter: Payer: BLUE CROSS/BLUE SHIELD | Admitting: Obstetrics and Gynecology

## 2018-12-28 ENCOUNTER — Telehealth: Payer: Self-pay | Admitting: Obstetrics and Gynecology

## 2018-12-28 DIAGNOSIS — Z1231 Encounter for screening mammogram for malignant neoplasm of breast: Secondary | ICD-10-CM

## 2018-12-28 NOTE — Telephone Encounter (Signed)
The patient called and stated that she is wanting Dr. Marcelline Mates to place the orders for her to schedule her mammogram now. Pt is requesting a call back after the orders are placed so she is able to schedule mammogram. Please advise.

## 2018-12-29 NOTE — Telephone Encounter (Signed)
Pt called and informed that her mammogram order had been placed and she will need to call [facility] and schedule an appt. Pt stated that she understood.

## 2019-01-17 ENCOUNTER — Telehealth: Payer: Self-pay | Admitting: Obstetrics and Gynecology

## 2019-01-17 NOTE — Telephone Encounter (Signed)
Pt called in to check status of meds. Refill for FLUOCETINE  20 mg pt is running out . Please advise

## 2019-01-18 NOTE — Telephone Encounter (Signed)
We can refill her medication for 4 months until her annual exam.   Dr. Marcelline Mates

## 2019-01-18 NOTE — Telephone Encounter (Signed)
Dr. Marcelline Mates, [place] is requesting a refill of prozac 20mg . Please advise.  Thanks [employer]

## 2019-01-18 NOTE — Telephone Encounter (Signed)
Pt called to make sure which mg of prozac did she needed a refill of. Pt stated the 20mg .

## 2019-01-19 ENCOUNTER — Other Ambulatory Visit: Payer: Self-pay

## 2019-01-19 MED ORDER — FLUOXETINE HCL 20 MG PO CAPS: 20.0000 mg | ORAL_CAPSULE | Freq: Every day | ORAL | 3 refills | Status: DC

## 2019-01-19 NOTE — Telephone Encounter (Signed)
Pt called and informed that her medication has been refilled and sent to her pharmacy.

## 2019-02-12 ENCOUNTER — Other Ambulatory Visit: Payer: Self-pay | Admitting: Obstetrics and Gynecology

## 2019-02-24 ENCOUNTER — Encounter: Payer: BLUE CROSS/BLUE SHIELD | Admitting: Obstetrics and Gynecology

## 2019-04-10 ENCOUNTER — Other Ambulatory Visit: Payer: Self-pay | Admitting: Obstetrics and Gynecology

## 2019-04-21 ENCOUNTER — Encounter: Payer: BLUE CROSS/BLUE SHIELD | Admitting: Obstetrics and Gynecology

## 2019-05-03 ENCOUNTER — Telehealth: Payer: Self-pay

## 2019-05-03 NOTE — Telephone Encounter (Signed)
Pt called no answer LM via VM asking to call the office to discuss her mediation refill request from [facility] for fluoxetine. Last refill was sent to [facility] and wanted to know if the medication needs to go to [facility] or [facility].

## 2019-05-03 NOTE — Progress Notes (Signed)
Pt present for annual exam. Pt LMP 01/2018. Completed cologuard order form for pt. Pt stated that she was doing well noproblems. GAD-7=18. PHQ-9=19.

## 2019-05-03 NOTE — Patient Instructions (Addendum)
Preventive Care 40-56 Years Old, Female Preventive care refers to visits with your health care provider and lifestyle choices that can promote health and wellness. This includes:  A yearly physical exam. This may also be called an annual well check.  Regular dental visits and eye exams.  Immunizations.  Screening for certain conditions.  Healthy lifestyle choices, such as eating a healthy diet, getting regular exercise, not using drugs or products that contain nicotine and tobacco, and limiting alcohol use. What can I expect for my preventive care visit? Physical exam Your health care provider will check your:  Height and weight. This may be used to calculate body mass index (BMI), which tells if you are at a healthy weight.  Heart rate and blood pressure.  Skin for abnormal spots. Counseling Your health care provider may ask you questions about your:  Alcohol, tobacco, and drug use.  Emotional well-being.  Home and relationship well-being.  Sexual activity.  Eating habits.  Work and work environment.  Method of birth control.  Menstrual cycle.  Pregnancy history. What immunizations do I need?  Influenza (flu) vaccine  This is recommended every year. Tetanus, diphtheria, and pertussis (Tdap) vaccine  You may need a Td booster every 10 years. Varicella (chickenpox) vaccine  You may need this if you have not been vaccinated. Zoster (shingles) vaccine  You may need this after age 60. Measles, mumps, and rubella (MMR) vaccine  You may need at least one dose of MMR if you were born in 1957 or later. You may also need a second dose. Pneumococcal conjugate (PCV13) vaccine  You may need this if you have certain conditions and were not previously vaccinated. Pneumococcal polysaccharide (PPSV23) vaccine  You may need one or two doses if you smoke cigarettes or if you have certain conditions. Meningococcal conjugate (MenACWY) vaccine  You may need this if you  have certain conditions. Hepatitis A vaccine  You may need this if you have certain conditions or if you travel or work in places where you may be exposed to hepatitis A. Hepatitis B vaccine  You may need this if you have certain conditions or if you travel or work in places where you may be exposed to hepatitis B. Haemophilus influenzae type b (Hib) vaccine  You may need this if you have certain conditions. Human papillomavirus (HPV) vaccine  If recommended by your health care provider, you may need three doses over 6 months. You may receive vaccines as individual doses or as more than one vaccine together in one shot (combination vaccines). Talk with your health care provider about the risks and benefits of combination vaccines. What tests do I need? Blood tests  Lipid and cholesterol levels. These may be checked every 5 years, or more frequently if you are over 50 years old.  Hepatitis C test.  Hepatitis B test. Screening  Lung cancer screening. You may have this screening every year starting at age 55 if you have a 30-pack-year history of smoking and currently smoke or have quit within the past 15 years.  Colorectal cancer screening. All adults should have this screening starting at age 50 and continuing until age 75. Your health care provider may recommend screening at age 45 if you are at increased risk. You will have tests every 1-10 years, depending on your results and the type of screening test.  Diabetes screening. This is done by checking your blood sugar (glucose) after you have not eaten for a while (fasting). You may have this   done every 1-3 years.  Mammogram. This may be done every 1-2 years. Talk with your health care provider about when you should start having regular mammograms. This may depend on whether you have a family history of breast cancer.  BRCA-related cancer screening. This may be done if you have a family history of breast, ovarian, tubal, or peritoneal  cancers.  Pelvic exam and Pap test. This may be done every 3 years starting at age 60. Starting at age 7, this may be done every 5 years if you have a Pap test in combination with an HPV test. Other tests  Sexually transmitted disease (STD) testing.  Bone density scan. This is done to screen for osteoporosis. You may have this scan if you are at high risk for osteoporosis. Follow these instructions at home: Eating and drinking  Eat a diet that includes fresh fruits and vegetables, whole grains, lean protein, and low-fat dairy.  Take vitamin and mineral supplements as recommended by your health care provider.  Do not drink alcohol if: ? Your health care provider tells you not to drink. ? You are pregnant, may be pregnant, or are planning to become pregnant.  If you drink alcohol: ? Limit how much you have to 0-1 drink a day. ? Be aware of how much alcohol is in your drink. In the [place] one drink equals one 12 oz bottle of beer (355 mL), one 5 oz glass of wine (148 mL), or one 1 oz glass of hard liquor (44 mL). Lifestyle  Take daily care of your teeth and gums.  Stay active. Exercise for at least 30 minutes on 5 or more days each week.  Do not use any products that contain nicotine or tobacco, such as cigarettes, e-cigarettes, and chewing tobacco. If you need help quitting, ask your health care provider.  If you are sexually active, practice safe sex. Use a condom or other form of birth control (contraception) in order to prevent pregnancy and STIs (sexually transmitted infections).  If told by your health care provider, take low-dose aspirin daily starting at age 48. What's next?  Visit your health care provider once a year for a well check visit.  Ask your health care provider how often you should have your eyes and teeth checked.  Stay up to date on all vaccines. This information is not intended to replace advice given to you by your health care provider. Make sure you  discuss any questions you have with your health care provider. Document Revised: 10/08/2017 Document Reviewed: 10/08/2017 Elsevier Patient Education  2020 [place] Breast self-awareness is knowing how your breasts look and feel. Doing breast self-awareness is important. It allows you to catch a breast problem early while it is still small and can be treated. All women should do breast self-awareness, including women who have had breast implants. Tell your doctor if you notice a change in your breasts. What you need:  A mirror.  A well-lit room. How to do a breast self-exam A breast self-exam is one way to learn what is normal for your breasts and to check for changes. To do a breast self-exam: Look for changes  1. Take off all the clothes above your waist. 2. Stand in front of a mirror in a room with good lighting. 3. Put your hands on your hips. 4. Push your hands down. 5. Look at your breasts and nipples in the mirror to see if one breast or nipple looks different from the  other. Check to see if: ? The shape of one breast is different. ? The size of one breast is different. ? There are wrinkles, dips, and bumps in one breast and not the other. 6. Look at each breast for changes in the skin, such as: ? Redness. ? Scaly areas. 7. Look for changes in your nipples, such as: ? Liquid around the nipples. ? Bleeding. ? Dimpling. ? Redness. ? A change in where the nipples are. Feel for changes  1. Lie on your back on the floor. 2. Feel each breast. To do this, follow these steps: ? Pick a breast to feel. ? Put the arm closest to that breast above your head. ? Use your other arm to feel the nipple area of your breast. Feel the area with the pads of your three middle fingers by making small circles with your fingers. For the first circle, press lightly. For the second circle, press harder. For the third circle, press even harder. ? Keep making circles with  your fingers at the different pressures as you move down your breast. Stop when you feel your ribs. ? Move your fingers a little toward the center of your body. ? Start making circles with your fingers again, this time going up until you reach your collarbone. ? Keep making up-and-down circles until you reach your armpit. Remember to keep using the three pressures. ? Feel the other breast in the same way. 3. Sit or stand in the tub or shower. 4. With soapy water on your skin, feel each breast the same way you did in step 2 when you were lying on the floor. Write down what you find Writing down what you find can help you remember what to tell your doctor. Write down:  What is normal for each breast.  Any changes you find in each breast, including: ? The kind of changes you find. ? Whether you have pain. ? Size and location of any lumps.  When you last had your menstrual period. General tips  Check your breasts every month.  If you are breastfeeding, the best time to check your breasts is after you feed your baby or after you use a breast pump.  If you get menstrual periods, the best time to check your breasts is 5-7 days after your menstrual period is over.  With time, you will become comfortable with the self-exam, and you will begin to know if there are changes in your breasts. Contact a doctor if you:  See a change in the shape or size of your breasts or nipples.  See a change in the skin of your breast or nipples, such as red or scaly skin.  Have fluid coming from your nipples that is not normal.  Find a lump or thick area that was not there before.  Have pain in your breasts.  Have any concerns about your breast health. Summary  Breast self-awareness includes looking for changes in your breasts, as well as feeling for changes within your breasts.  Breast self-awareness should be done in front of a mirror in a well-lit room.  You should check your breasts every month.  If you get menstrual periods, the best time to check your breasts is 5-7 days after your menstrual period is over.  Let your doctor know of any changes you see in your breasts, including changes in size, changes on the skin, pain or tenderness, or fluid from your nipples that is not normal. This information is not  intended to replace advice given to you by your health care provider. Make sure you discuss any questions you have with your health care provider. Document Revised: 09/15/2017 Document Reviewed: 09/15/2017 Elsevier Patient Education  [place] Maintenance for Postmenopausal Women Menopause is a normal process in which your ability to get pregnant comes to an end. This process happens slowly over many months or years, usually between the ages of 50 and 76. Menopause is complete when you have missed your menstrual periods for 12 months. It is important to talk with your health care provider about some of the most common conditions that affect women after menopause (postmenopausal women). These include heart disease, cancer, and bone loss (osteoporosis). Adopting a healthy lifestyle and getting preventive care can help to promote your health and wellness. The actions you take can also lower your chances of developing some of these common conditions. What should I know about menopause? During menopause, you may get a number of symptoms, such as:  Hot flashes. These can be moderate or severe.  Night sweats.  Decrease in sex drive.  Mood swings.  Headaches.  Tiredness.  Irritability.  Memory problems.  Insomnia. Choosing to treat or not to treat these symptoms is a decision that you make with your health care provider. Do I need hormone replacement therapy?  Hormone replacement therapy is effective in treating symptoms that are caused by menopause, such as hot flashes and night sweats.  Hormone replacement carries certain risks, especially as you become older.  If you are thinking about using estrogen or estrogen with progestin, discuss the benefits and risks with your health care provider. What is my risk for heart disease and stroke? The risk of heart disease, heart attack, and stroke increases as you age. One of the causes may be a change in the body's hormones during menopause. This can affect how your body uses dietary fats, triglycerides, and cholesterol. Heart attack and stroke are medical emergencies. There are many things that you can do to help prevent heart disease and stroke. Watch your blood pressure  High blood pressure causes heart disease and increases the risk of stroke. This is more likely to develop in people who have high blood pressure readings, are of African descent, or are overweight.  Have your blood pressure checked: ? Every 3-5 years if you are 57-57 years of age. ? Every year if you are 77 years old or older. Eat a healthy diet   Eat a diet that includes plenty of vegetables, fruits, low-fat dairy products, and lean protein.  Do not eat a lot of foods that are high in solid fats, added sugars, or sodium. Get regular exercise Get regular exercise. This is one of the most important things you can do for your health. Most adults should:  Try to exercise for at least 150 minutes each week. The exercise should increase your heart rate and make you sweat (moderate-intensity exercise).  Try to do strengthening exercises at least twice each week. Do these in addition to the moderate-intensity exercise.  Spend less time sitting. Even light physical activity can be beneficial. Other tips  Work with your health care provider to achieve or maintain a healthy weight.  Do not use any products that contain nicotine or tobacco, such as cigarettes, e-cigarettes, and chewing tobacco. If you need help quitting, ask your health care provider.  Know your numbers. Ask your health care provider to check your cholesterol and your blood  sugar (glucose). Continue  to have your blood tested as directed by your health care provider. Do I need screening for cancer? Depending on your health history and family history, you may need to have cancer screening at different stages of your life. This may include screening for:  Breast cancer.  Cervical cancer.  Lung cancer.  Colorectal cancer. What is my risk for osteoporosis? After menopause, you may be at increased risk for osteoporosis. Osteoporosis is a condition in which bone destruction happens more quickly than new bone creation. To help prevent osteoporosis or the bone fractures that can happen because of osteoporosis, you may take the following actions:  If you are 54-42 years old, get at least 1,000 mg of calcium and at least 600 mg of vitamin D per day.  If you are older than age 40 but younger than age 23, get at least 1,200 mg of calcium and at least 600 mg of vitamin D per day.  If you are older than age 69, get at least 1,200 mg of calcium and at least 800 mg of vitamin D per day. Smoking and drinking excessive alcohol increase the risk of osteoporosis. Eat foods that are rich in calcium and vitamin D, and do weight-bearing exercises several times each week as directed by your health care provider. How does menopause affect my mental health? Depression may occur at any age, but it is more common as you become older. Common symptoms of depression include:  Low or sad mood.  Changes in sleep patterns.  Changes in appetite or eating patterns.  Feeling an overall lack of motivation or enjoyment of activities that you previously enjoyed.  Frequent crying spells. Talk with your health care provider if you think that you are experiencing depression. General instructions See your health care provider for regular wellness exams and vaccines. This may include:  Scheduling regular health, dental, and eye exams.  Getting and maintaining your vaccines. These  include: ? Influenza vaccine. Get this vaccine each year before the flu season begins. ? Pneumonia vaccine. ? Shingles vaccine. ? Tetanus, diphtheria, and pertussis (Tdap) booster vaccine. Your health care provider may also recommend other immunizations. Tell your health care provider if you have ever been abused or do not feel safe at home. Summary  Menopause is a normal process in which your ability to get pregnant comes to an end.  This condition causes hot flashes, night sweats, decreased interest in sex, mood swings, headaches, or lack of sleep.  Treatment for this condition may include hormone replacement therapy.  Take actions to keep yourself healthy, including exercising regularly, eating a healthy diet, watching your weight, and checking your blood pressure and blood sugar levels.  Get screened for cancer and depression. Make sure that you are up to date with all your vaccines. This information is not intended to replace advice given to you by your health care provider. Make sure you discuss any questions you have with your health care provider. Document Revised: 01/20/2018 Document Reviewed: 01/20/2018 Elsevier Patient Education  2020 [place].    Menopause Menopause is the normal time of life when menstrual periods stop completely. It is usually confirmed by 12 months without a menstrual period. The transition to menopause (perimenopause) most often happens between the ages of 70 and 78. During perimenopause, hormone levels change in your body, which can cause symptoms and affect your health. Menopause may increase your risk for:  Loss of bone (osteoporosis), which causes bone breaks (fractures).  Depression.  Hardening and narrowing of  the arteries (atherosclerosis), which can cause heart attacks and strokes. What are the causes? This condition is usually caused by a natural change in hormone levels that happens as you get older. The condition may also be caused by  surgery to remove both ovaries (bilateral oophorectomy). What increases the risk? This condition is more likely to start at an earlier age if you have certain medical conditions or treatments, including:  A tumor of the pituitary gland in the brain.  A disease that affects the ovaries and hormone production.  Radiation treatment for cancer.  Certain cancer treatments, such as chemotherapy or hormone (anti-estrogen) therapy.  Heavy smoking and excessive alcohol use.  Family history of early menopause. This condition is also more likely to develop earlier in women who are very thin. What are the signs or symptoms? Symptoms of this condition include:  Hot flashes.  Irregular menstrual periods.  Night sweats.  Changes in feelings about sex. This could be a decrease in sex drive or an increased comfort around your sexuality.  Vaginal dryness and thinning of the vaginal walls. This may cause painful intercourse.  Dryness of the skin and development of wrinkles.  Headaches.  Problems sleeping (insomnia).  Mood swings or irritability.  Memory problems.  Weight gain.  Hair growth on the face and chest.  Bladder infections or problems with urinating. How is this diagnosed? This condition is diagnosed based on your medical history, a physical exam, your age, your menstrual history, and your symptoms. Hormone tests may also be done. How is this treated? In some cases, no treatment is needed. You and your health care provider should make a decision together about whether treatment is necessary. Treatment will be based on your individual condition and preferences. Treatment for this condition focuses on managing symptoms. Treatment may include:  Menopausal hormone therapy (MHT).  Medicines to treat specific symptoms or complications.  Acupuncture.  Vitamin or herbal supplements. Before starting treatment, make sure to let your health care provider know if you have a personal  or family history of:  Heart disease.  Breast cancer.  Blood clots.  Diabetes.  Osteoporosis. Follow these instructions at home: Lifestyle  Do not use any products that contain nicotine or tobacco, such as cigarettes and e-cigarettes. If you need help quitting, ask your health care provider.  Get at least 30 minutes of physical activity on 5 or more days each week.  Avoid alcoholic and caffeinated beverages, as well as spicy foods. This may help prevent hot flashes.  Get 7-8 hours of sleep each night.  If you have hot flashes, try: ? Dressing in layers. ? Avoiding things that may trigger hot flashes, such as spicy food, warm places, or stress. ? Taking slow, deep breaths when a hot flash starts. ? Keeping a fan in your home and office.  Find ways to manage stress, such as deep breathing, meditation, or journaling.  Consider going to group therapy with other women who are having menopause symptoms. Ask your health care provider about recommended group therapy meetings. Eating and drinking  Eat a healthy, balanced diet that contains whole grains, lean protein, low-fat dairy, and plenty of fruits and vegetables.  Your health care provider may recommend adding more soy to your diet. Foods that contain soy include tofu, tempeh, and soy milk.  Eat plenty of foods that contain calcium and vitamin D for bone health. Items that are rich in calcium include low-fat milk, yogurt, beans, almonds, sardines, broccoli, and kale. Medicines  Take  over-the-counter and prescription medicines only as told by your health care provider.  Talk with your health care provider before starting any herbal supplements. If prescribed, take vitamins and supplements as told by your health care provider. These may include: ? Calcium. Women age 56 and older should get 1,200 mg (milligrams) of calcium every day. ? Vitamin D. Women need 600-800 International Units of vitamin D each day. ? Vitamins B12 and  B6. Aim for 50 micrograms of B12 and 1.5 mg of B6 each day. General instructions  Keep track of your menstrual periods, including: ? When they occur. ? How heavy they are and how long they last. ? How much time passes between periods.  Keep track of your symptoms, noting when they start, how often you have them, and how long they last.  Use vaginal lubricants or moisturizers to help with vaginal dryness and improve comfort during sex.  Keep all follow-up visits as told by your health care provider. This is important. This includes any group therapy or counseling. Contact a health care provider if:  You are still having menstrual periods after age 81.  You have pain during sex.  You have not had a period for 12 months and you develop vaginal bleeding. Get help right away if:  You have: ? Severe depression. ? Excessive vaginal bleeding. ? Pain when you urinate. ? A fast or irregular heart beat (palpitations). ? Severe headaches. ? Abdomen (abdominal) pain or severe indigestion.  You fell and you think you have a broken bone.  You develop leg or chest pain.  You develop vision problems.  You feel a lump in your breast. Summary  Menopause is the normal time of life when menstrual periods stop completely. It is usually confirmed by 12 months without a menstrual period.  The transition to menopause (perimenopause) most often happens between the ages of 35 and 6.  Symptoms can be managed through medicines, lifestyle changes, and complementary therapies such as acupuncture.  Eat a balanced diet that is rich in nutrients to promote bone health and heart health and to manage symptoms during menopause. This information is not intended to replace advice given to you by your health care provider. Make sure you discuss any questions you have with your health care provider. Document Revised: 01/09/2017 Document Reviewed: 03/01/2016 Elsevier Patient Education  2020 [place] Breast self-awareness means being familiar with how your breasts look and feel. It involves checking your breasts regularly and reporting any changes to your health care provider. Practicing breast self-awareness is important. Sometimes changes may not be harmful (are benign), but sometimes a change in your breasts can be a sign of a serious medical problem. It is important to learn how to do this procedure correctly so that you can catch problems early, when treatment is more likely to be successful. All women should practice breast self-awareness, including women who have had breast implants. What you need:  A mirror.  A well-lit room. How to do a breast self-exam A breast self-exam is one way to learn what is normal for your breasts and whether your breasts are changing. To do a breast self-exam: Look for changes  1. Remove all the clothing above your waist. 2. Stand in front of a mirror in a room with good lighting. 3. Put your hands on your hips. 4. Push your hands firmly downward. 5. Compare your breasts in the mirror. Look for differences between them (asymmetry), such as: ?  Differences in shape. ? Differences in size. ? Puckers, dips, and bumps in one breast and not the other. 6. Look at each breast for changes in the skin, such as: ? Redness. ? Scaly areas. 7. Look for changes in your nipples, such as: ? Discharge. ? Bleeding. ? Dimpling. ? Redness. ? A change in position. Feel for changes Carefully feel your breasts for lumps and changes. It is best to do this while lying on your back on the floor, and again while sitting or standing in the tub or shower with soapy water on your skin. Feel each breast in the following way: 1. Place the arm on the side of the breast you are examining above your head. 2. Feel your breast with the other hand. 3. Start in the nipple area and make -inch (2 cm) overlapping circles to feel your breast. Use the  pads of your three middle fingers to do this. Apply light pressure, then medium pressure, then firm pressure. The light pressure will allow you to feel the tissue closest to the skin. The medium pressure will allow you to feel the tissue that is a little deeper. The firm pressure will allow you to feel the tissue close to the ribs. 4. Continue the overlapping circles, moving downward over the breast until you feel your ribs below your breast. 5. Move one finger-width toward the center of the body. Continue to use the -inch (2 cm) overlapping circles to feel your breast as you move slowly up toward your collarbone. 6. Continue the up-and-down exam using all three pressures until you reach your armpit.  Write down what you find Writing down what you find can help you remember what to discuss with your health care provider. Write down:  What is normal for each breast.  Any changes that you find in each breast, including: ? The kind of changes you find. ? Any pain or tenderness. ? Size and location of any lumps.  Where you are in your menstrual cycle, if you are still menstruating. General tips and recommendations  Examine your breasts every month.  If you are breastfeeding, the best time to examine your breasts is after a feeding or after using a breast pump.  If you menstruate, the best time to examine your breasts is 5-7 days after your period. Breasts are generally lumpier during menstrual periods, and it may be more difficult to notice changes.  With time and practice, you will become more familiar with the variations in your breasts and more comfortable with the exam. Contact a health care provider if you:  See a change in the shape or size of your breasts or nipples.  See a change in the skin of your breast or nipples, such as a reddened or scaly area.  Have unusual discharge from your nipples.  Find a lump or thick area that was not there before.  Have pain in your  breasts.  Have any concerns related to your breast health. Summary  Breast self-awareness includes looking for physical changes in your breasts, as well as feeling for any changes within your breasts.  Breast self-awareness should be performed in front of a mirror in a well-lit room.  You should examine your breasts every month. If you menstruate, the best time to examine your breasts is 5-7 days after your menstrual period.  Let your health care provider know of any changes you notice in your breasts, including changes in size, changes on the skin, pain or tenderness, or  unusual fluid from your nipples. This information is not intended to replace advice given to you by your health care provider. Make sure you discuss any questions you have with your health care provider. Document Revised: 09/15/2017 Document Reviewed: 09/15/2017 Elsevier Patient Education  [place].

## 2019-05-04 ENCOUNTER — Other Ambulatory Visit: Payer: Self-pay

## 2019-05-04 ENCOUNTER — Other Ambulatory Visit (HOSPITAL_COMMUNITY)
Admission: RE | Admit: 2019-05-04 | Discharge: 2019-05-04 | Disposition: A | Discharge status: Home / Self Care | Payer: Self-pay | Source: Ambulatory Visit | Attending: Obstetrics and Gynecology | Admitting: Obstetrics and Gynecology

## 2019-05-04 ENCOUNTER — Encounter: Payer: Self-pay | Admitting: Obstetrics and Gynecology

## 2019-05-04 ENCOUNTER — Ambulatory Visit (INDEPENDENT_AMBULATORY_CARE_PROVIDER_SITE_OTHER): Payer: BC Managed Care – PPO | Admitting: Obstetrics and Gynecology

## 2019-05-04 VITALS — BP 134/86 | HR 78 | Ht 65.0 in | Wt 177.8 lb

## 2019-05-04 DIAGNOSIS — Z01419 Encounter for gynecological examination (general) (routine) without abnormal findings: Secondary | ICD-10-CM | POA: Diagnosis not present

## 2019-05-04 DIAGNOSIS — R7303 Prediabetes: Secondary | ICD-10-CM

## 2019-05-04 DIAGNOSIS — Z1231 Encounter for screening mammogram for malignant neoplasm of breast: Secondary | ICD-10-CM | POA: Diagnosis not present

## 2019-05-04 DIAGNOSIS — Z124 Encounter for screening for malignant neoplasm of cervix: Secondary | ICD-10-CM

## 2019-05-04 DIAGNOSIS — F419 Anxiety disorder, unspecified: Secondary | ICD-10-CM

## 2019-05-04 DIAGNOSIS — Z1211 Encounter for screening for malignant neoplasm of colon: Secondary | ICD-10-CM

## 2019-05-04 DIAGNOSIS — E785 Hyperlipidemia, unspecified: Secondary | ICD-10-CM

## 2019-05-04 DIAGNOSIS — N951 Menopausal and female climacteric states: Secondary | ICD-10-CM

## 2019-05-04 DIAGNOSIS — E663 Overweight: Secondary | ICD-10-CM

## 2019-05-04 DIAGNOSIS — F329 Major depressive disorder, single episode, unspecified: Secondary | ICD-10-CM

## 2019-05-04 NOTE — Progress Notes (Signed)
ANNUAL PREVENTATIVE CARE GYNECOLOGY  ENCOUNTER NOTE  Subjective:       Michelle Dunlap is a 54 y.o. [phone] female here for a routine annual gynecologic exam. She is transitioning care from Dr. Daphine Deutscher Defrancesco who has retired. The patient is sexually active. The patient has never taken hormone replacement therapy. Patient denies post-menopausal vaginal bleeding. The patient wears seatbelts: yes. The patient participates in regular exercise: no. Has the patient ever been transfused or tattooed?: no.   Current complaints: 1.  Notes that she is going through menopause, stopped her menses in 2019. Now having hot flashes and night sweats daily, 20 or more times per day. Notes she is taking a menopausal supplement once daily, and is just starting taking pumpkin seeds this week.     Gynecologic History Patient's last menstrual period was 01/11/2018. Contraception: post menopausal status and tubal ligation Last Pap: 03/18/2016. Results were: normal Last mammogram: 01/21/2017. Results were: normal Last Colonoscopy: Patient has never had one. Has had stool guaic testing in the past, but non recently.  Last Dexa Scan: Patient has never ha done.    Obstetric History OB History  Gravida Para Term Preterm AB Living  3 2 2   1 2   SAB TAB Ectopic Multiple Live Births  1       2    # Outcome Date GA Lbr Len/2nd Weight Sex Delivery Anes PTL Lv  3 Term 1997   8 lb 14.4 oz (4.037 kg) M Vag-Spont   LIV  2 Term 1993   8 lb 14.4 oz (4.037 kg) M Vag-Spont   LIV  1 SAB 1992            Past Medical History:  Diagnosis Date  . Depression    03/2000- stable on meds- 01/2014- fluoxetine rx effective 20mg  qd  . History of UTI   . Increased BMI   . Perimenopausal     Family History  Problem Relation Age of Onset  . Mental retardation Sister        passed away 14  . Cancer Neg Hx   . Diabetes Neg Hx   . Heart disease Neg Hx     Past Surgical History:  Procedure Laterality Date  . DILATION  AND CURETTAGE OF UTERUS  1992   missed ab  . TUBAL LIGATION  1998    Social History   Socioeconomic History  . Marital status: Married    Spouse name: Not on file  . Number of children: Not on file  . Years of education: Not on file  . Highest education level: Not on file  Occupational History  . Not on file  Tobacco Use  . Smoking status: Never Smoker  . Smokeless tobacco: Never Used  Substance and Sexual Activity  . Alcohol use: No  . Drug use: No  . Sexual activity: Yes    Birth control/protection: Surgical  Other Topics Concern  . Not on file  Social History Narrative  . Not on file   Social Determinants of Health   Financial Resource Strain:   . Difficulty of Paying Living Expenses:   Food Insecurity:   . Worried About in the Last Year:   . 20 in the Last Year:   Transportation Needs:   . Programme researcher, broadcasting/film/video (Medical):   Barista Lack of Transportation (Non-Medical):   Physical Activity:   . Days of Exercise per Week:   . Minutes of Exercise  per Session:   Stress:   . Feeling of Stress :   Social Connections:   . Frequency of Communication with Friends and Family:   . Frequency of Social Gatherings with Friends and Family:   . Attends Religious Services:   . Active Member of Clubs or Organizations:   . Attends Banker Meetings:   Marland Kitchen Marital Status:   Intimate Partner Violence:   . Fear of Current or Ex-Partner:   . Emotionally Abused:   Marland Kitchen Physically Abused:   . Sexually Abused:     Current Outpatient Medications on File Prior to Visit  Medication Sig Dispense Refill  . Ascorbic Acid (VITAMIN C) 100 MG tablet Take 100 mg by mouth daily.    . Biotin 10 MG CAPS Take by mouth daily.    . calcium carbonate (OS-CAL) 600 MG TABS tablet Take 600 mg by mouth 2 (two) times daily with a meal.    . cholecalciferol (VITAMIN D) 1000 UNITS tablet Take 1,000 Units by mouth daily.    Marland Kitchen FLUoxetine (PROZAC) 20 MG capsule TAKE 1  CAPSULE BY MOUTH EVERY DAY 90 capsule 2  . thiamine (VITAMIN B-1) 100 MG tablet Take 100 mg by mouth daily.    . [DISCONTINUED] FLUoxetine (PROZAC) 20 MG capsule Take 1 capsule (20 mg total) by mouth daily. 30 capsule 3   No current facility-administered medications on file prior to visit.    No Known Allergies    Review of Systems ROS Review of Systems - General ROS: negative for - chills, fatigue, fever,, weight gain or weight loss.  Positive for hot flashes, night sweats (see HPI). Psychological ROS: negative for - , decreased libido, mood swings, physical abuse or sexual abuse.  Positive for depression and anxiety (however feels controlled on Prozac) Ophthalmic ROS: negative for - blurry vision, eye pain or loss of vision ENT ROS: negative for - headaches, hearing change, visual changes or vocal changes Allergy and Immunology ROS: negative for - hives, itchy/watery eyes or seasonal allergies Hematological and Lymphatic ROS: negative for - bleeding problems, bruising, swollen lymph nodes or weight loss Endocrine ROS: negative for - galactorrhea, hair pattern changes, hot flashes, malaise/lethargy, mood swings, palpitations, polydipsia/polyuria, skin changes, temperature intolerance or unexpected weight changes Breast ROS: negative for - new or changing breast lumps or nipple discharge Respiratory ROS: negative for - cough or shortness of breath Cardiovascular ROS: negative for - chest pain, irregular heartbeat, palpitations or shortness of breath Gastrointestinal ROS: no abdominal pain, change in bowel habits, or black or bloody stools Genito-Urinary ROS: no dysuria, trouble voiding, or hematuria Musculoskeletal ROS: negative for - joint pain or joint stiffness Neurological ROS: negative for - bowel and bladder control changes Dermatological ROS: negative for rash and skin lesion changes   Objective:   BP 134/86   Pulse 78   Ht 5\' 5"  (1.651 m)   Wt 177 lb 12.8 oz (80.6 kg)   LMP  01/11/2018   BMI 29.59 kg/m  CONSTITUTIONAL: Well-developed, well-nourished female in no acute distress.  PSYCHIATRIC: Normal mood and affect. Normal behavior. Normal judgment and thought content. NEUROLGIC: Alert and oriented to person, place, and time. Normal muscle tone coordination. No cranial nerve deficit noted. HENT:  Normocephalic, atraumatic, External right and left ear normal. Oropharynx is clear and moist EYES: Conjunctivae and EOM are normal. Pupils are equal, round, and reactive to light. No scleral icterus.  NECK: Normal range of motion, supple, no masses.  Normal thyroid.  SKIN: Skin is  warm and dry. No rash noted. Not diaphoretic. No erythema. No pallor. CARDIOVASCULAR: Normal heart rate noted, regular rhythm, no murmur. RESPIRATORY: Clear to auscultation bilaterally. Effort and breath sounds normal, no problems with respiration noted. BREASTS: Symmetric in size. No masses, skin changes, nipple drainage, or lymphadenopathy. ABDOMEN: Soft, normal bowel sounds, no distention noted.  No tenderness, rebound or guarding.  BLADDER: Normal PELVIC:  Bladder no bladder distension noted  Urethra: normal appearing urethra with no masses, tenderness or lesions  Vulva: normal appearing vulva with no masses, tenderness or lesions  Vagina: mildly atrophic but otherwise normal appearing vagina without discharge, no lesions and atrophic  Cervix: normal appearing cervix without discharge or lesions  Uterus: uterus is normal size, shape, consistency and nontender  Adnexa: normal adnexa in size, nontender and no masses  RV: External Exam NormaI, No Rectal Masses and Normal Sphincter tone  MUSCULOSKELETAL: Normal range of motion. No tenderness.  No cyanosis, clubbing, or edema.  2+ distal pulses. LYMPHATIC: No Axillary, Supraclavicular, or Inguinal Adenopathy.   Labs: No results found for: WBC, HGB, HCT, MCV, PLT  No results found for: CREATININE, BUN, NA, K, CL, CO2  No results found  for: ALT, AST, GGT, ALKPHOS, BILITOT  Lab Results  Component Value Date   CHOL 207 (H) 04/09/2017   HDL 54 04/09/2017   LDLCALC 119 (H) 04/09/2017   TRIG 172 (H) 04/09/2017   CHOLHDL 3.8 04/09/2017    Lab Results  Component Value Date   TSH 3.140 04/09/2017    Lab Results  Component Value Date   HGBA1C 5.7 (H) 04/09/2017     Assessment:   1. Encounter for well woman exam with routine gynecological exam   2. Encounter for screening mammogram for breast cancer   3. Colon cancer screening   4. Cervical cancer screening   5. Anxiety and depression   6. Menopausal vasomotor syndrome   7. Prediabetes   8. Overweight (BMI 25.0-29.9)   9. Dyslipidemia    Plan:  - Pap: Pap Co Test performed today.  - Mammogram: Ordered - Stool Guaiac Testing:  Ordered (patient desired info on Cologuard).  - Labs: CBC, CMP, Lipids, TSH, Hemoglobin A1C and Vit D Level. - Routine preventative health maintenance measures emphasized: Exercise/Diet/Weight control, Tobacco Warnings, Alcohol/Substance use risks, Stress Management and Peer Pressure Issues - Anxiety and depression currently on Prozac. Patient feels symptoms are well managed, however PHQ-9 and GAD scores elevated (19 and 17 respectively).  Still having depression symptoms although initially was thought to be associated with PMDD and is now menopausal.  - Patient with bothersome menopausal vasomotor symptoms. Discussed lifestyle interventions such as wearing light clothing, remaining in cool environments, having fan/air conditioner in the room, avoiding hot beverages etc.  Discussed using hormone therapy and concerns about increased risk of heart disease, cerebrovascular disease, thromboembolic disease,  and breast cancer.  Also discussed other medical options such as Paxil, Effexor, Brisdelle, Clonidine,  or Neurontin.   Also discussed alternative therapies such as herbal remedies but cautioned that most of the products contained phytoestrogens  (plant estrogens) in unregulated amounts which can have the same effects on the body as the pharmaceutical estrogen preparations.  Also referred her to www.[email or web address].org for other alternative options.  Patient opted for herbal supplementation for now. Given list of acceptable options.  Can return if she desires to try prescription medications.  - Flu vaccine up to date.  - Return to Clinic - 1 Year   Hildred Laser, MD  [facility]

## 2019-05-05 LAB — CBC
Hematocrit: 41.6 % (ref 34.0–46.6)
Hemoglobin: 14.1 g/dL (ref 11.1–15.9)
MCH: 31.5 pg (ref 26.6–33.0)
MCHC: 33.9 g/dL (ref 31.5–35.7)
MCV: 93 fL (ref 79–97)
Platelets: 277 10*3/uL (ref 150–450)
RBC: 4.47 x10E6/uL (ref 3.77–5.28)
RDW: 13.1 % (ref 11.7–15.4)
WBC: 6.3 10*3/uL (ref 3.4–10.8)

## 2019-05-05 LAB — HEMOGLOBIN A1C
Est. average glucose Bld gHb Est-mCnc: 120 mg/dL
Hgb A1c MFr Bld: 5.8 % — ABNORMAL HIGH (ref 4.8–5.6)

## 2019-05-05 LAB — VITAMIN D 25 HYDROXY (VIT D DEFICIENCY, FRACTURES): Vit D, 25-Hydroxy: 39.5 ng/mL (ref 30.0–100.0)

## 2019-05-05 LAB — COMPREHENSIVE METABOLIC PANEL
ALT: 8 IU/L (ref 0–32)
AST: 18 IU/L (ref 0–40)
Albumin/Globulin Ratio: 2.3 — ABNORMAL HIGH (ref 1.2–2.2)
Albumin: 4.9 g/dL (ref 3.8–4.9)
Alkaline Phosphatase: 86 IU/L (ref 39–117)
BUN/Creatinine Ratio: 23 (ref 9–23)
BUN: 18 mg/dL (ref 6–24)
Bilirubin Total: 0.2 mg/dL (ref 0.0–1.2)
CO2: 24 mmol/L (ref 20–29)
Calcium: 9.6 mg/dL (ref 8.7–10.2)
Chloride: 103 mmol/L (ref 96–106)
Creatinine, Ser: 0.8 mg/dL (ref 0.57–1.00)
GFR calc Af Amer: 95 mL/min/{1.73_m2} (ref >59)
GFR calc non Af Amer: 83 mL/min/{1.73_m2} (ref >59)
Globulin, Total: 2.1 g/dL (ref 1.5–4.5)
Glucose: 92 mg/dL (ref 65–99)
Potassium: 4.6 mmol/L (ref 3.5–5.2)
Sodium: 141 mmol/L (ref 134–144)
Total Protein: 7 g/dL (ref 6.0–8.5)

## 2019-05-05 LAB — LIPID PANEL
Chol/HDL Ratio: 3.7 ratio (ref 0.0–4.4)
Cholesterol, Total: 201 mg/dL — ABNORMAL HIGH (ref 100–199)
HDL: 54 mg/dL (ref >39)
LDL Chol Calc (NIH): 121 mg/dL — ABNORMAL HIGH (ref 0–99)
Triglycerides: 145 mg/dL (ref 0–149)
VLDL Cholesterol Cal: 26 mg/dL (ref 5–40)

## 2019-05-05 LAB — TSH: TSH: 2.25 u[IU]/mL (ref 0.450–4.500)

## 2019-05-06 LAB — CYTOLOGY - PAP
Comment: NEGATIVE
Diagnosis: NEGATIVE
High risk HPV: NEGATIVE

## 2019-05-26 ENCOUNTER — Telehealth: Payer: Self-pay | Admitting: General Practice

## 2019-05-26 NOTE — Telephone Encounter (Signed)
Alona Bene from  [employer] called in and stated that she is sending a fax and dr cherry needs to sign off on it. As well as when it its  faxed back can you please put the  order number on form. The number is  [phone]. Please advise

## 2019-07-11 LAB — COLOGUARD: Cologuard: NEGATIVE

## 2019-07-20 LAB — EXTERNAL GENERIC LAB PROCEDURE: COLOGUARD: NEGATIVE

## 2019-10-26 ENCOUNTER — Other Ambulatory Visit: Payer: Self-pay

## 2019-10-26 MED ORDER — FLUOXETINE HCL 20 MG PO CAPS: ORAL_CAPSULE | ORAL | 0 refills | Status: DC

## 2020-05-09 ENCOUNTER — Encounter: Payer: Self-pay | Admitting: Obstetrics and Gynecology

## 2020-05-09 ENCOUNTER — Other Ambulatory Visit: Payer: Self-pay

## 2020-05-09 ENCOUNTER — Ambulatory Visit (INDEPENDENT_AMBULATORY_CARE_PROVIDER_SITE_OTHER): Payer: No Typology Code available for payment source | Admitting: Obstetrics and Gynecology

## 2020-05-09 VITALS — BP 129/86 | HR 76 | Ht 65.0 in | Wt 178.9 lb

## 2020-05-09 DIAGNOSIS — Z800 Family history of malignant neoplasm of digestive organs: Secondary | ICD-10-CM | POA: Diagnosis not present

## 2020-05-09 DIAGNOSIS — Z01419 Encounter for gynecological examination (general) (routine) without abnormal findings: Secondary | ICD-10-CM

## 2020-05-09 DIAGNOSIS — N951 Menopausal and female climacteric states: Secondary | ICD-10-CM

## 2020-05-09 DIAGNOSIS — F419 Anxiety disorder, unspecified: Secondary | ICD-10-CM

## 2020-05-09 DIAGNOSIS — Z1231 Encounter for screening mammogram for malignant neoplasm of breast: Secondary | ICD-10-CM

## 2020-05-09 DIAGNOSIS — F32A Depression, unspecified: Secondary | ICD-10-CM

## 2020-05-09 DIAGNOSIS — E663 Overweight: Secondary | ICD-10-CM

## 2020-05-09 DIAGNOSIS — E785 Hyperlipidemia, unspecified: Secondary | ICD-10-CM

## 2020-05-09 DIAGNOSIS — R7303 Prediabetes: Secondary | ICD-10-CM | POA: Insufficient documentation

## 2020-05-09 MED ORDER — FLUOXETINE HCL 20 MG PO CAPS: ORAL_CAPSULE | ORAL | 3 refills | Status: DC

## 2020-05-09 NOTE — Patient Instructions (Signed)
Preventive Care 84-57 Years Old, Female Preventive care refers to lifestyle choices and visits with your health care provider that can promote health and wellness. This includes:  A yearly physical exam. This is also called an annual wellness visit.  Regular dental and eye exams.  Immunizations.  Screening for certain conditions.  Healthy lifestyle choices, such as: ? Eating a healthy diet. ? Getting regular exercise. ? Not using drugs or products that contain nicotine and tobacco. ? Limiting alcohol use. What can I expect for my preventive care visit? Physical exam Your health care provider will check your:  Height and weight. These may be used to calculate your BMI (body mass index). BMI is a measurement that tells if you are at a healthy weight.  Heart rate and blood pressure.  Body temperature.  Skin for abnormal spots. Counseling Your health care provider may ask you questions about your:  Past medical problems.  Family's medical history.  Alcohol, tobacco, and drug use.  Emotional well-being.  Home life and relationship well-being.  Sexual activity.  Diet, exercise, and sleep habits.  Work and work Statistician.  Access to firearms.  Method of birth control.  Menstrual cycle.  Pregnancy history. What immunizations do I need? Vaccines are usually given at various ages, according to a schedule. Your health care provider will recommend vaccines for you based on your age, medical history, and lifestyle or other factors, such as travel or where you work.   What tests do I need? Blood tests  Lipid and cholesterol levels. These may be checked every 5 years, or more often if you are over 57 years old.  Hepatitis C test.  Hepatitis B test. Screening  Lung cancer screening. You may have this screening every year starting at age 57 if you have a 30-pack-year history of smoking and currently smoke or have quit within the past 15 years.  Colorectal cancer  screening. ? All adults should have this screening starting at age 57 and continuing until age 17. ? Your health care provider may recommend screening at age 57 if you are at increased risk. ? You will have tests every 1-10 years, depending on your results and the type of screening test.  Diabetes screening. ? This is done by checking your blood sugar (glucose) after you have not eaten for a while (fasting). ? You may have this done every 1-3 years.  Mammogram. ? This may be done every 1-2 years. ? Talk with your health care provider about when you should start having regular mammograms. This may depend on whether you have a family history of breast cancer.  BRCA-related cancer screening. This may be done if you have a family history of breast, ovarian, tubal, or peritoneal cancers.  Pelvic exam and Pap test. ? This may be done every 3 years starting at age 57. ? Starting at age 57, this may be done every 5 years if you have a Pap test in combination with an HPV test. Other tests  STD (sexually transmitted disease) testing, if you are at risk.  Bone density scan. This is done to screen for osteoporosis. You may have this scan if you are at high risk for osteoporosis. Talk with your health care provider about your test results, treatment options, and if necessary, the need for more tests. Follow these instructions at home: Eating and drinking  Eat a diet that includes fresh fruits and vegetables, whole grains, lean protein, and low-fat dairy products.  Take vitamin and mineral supplements  as recommended by your health care provider.  Do not drink alcohol if: ? Your health care provider tells you not to drink. ? You are pregnant, may be pregnant, or are planning to become pregnant.  If you drink alcohol: ? Limit how much you have to 0-1 drink a day. ? Be aware of how much alcohol is in your drink. In the [place] one drink equals one 12 oz bottle of beer (355 mL), one 5 oz glass of  wine (148 mL), or one 1 oz glass of hard liquor (44 mL).   Lifestyle  Take daily care of your teeth and gums. Brush your teeth every morning and night with fluoride toothpaste. Floss one time each day.  Stay active. Exercise for at least 30 minutes 5 or more days each week.  Do not use any products that contain nicotine or tobacco, such as cigarettes, e-cigarettes, and chewing tobacco. If you need help quitting, ask your health care provider.  Do not use drugs.  If you are sexually active, practice safe sex. Use a condom or other form of protection to prevent STIs (sexually transmitted infections).  If you do not wish to become pregnant, use a form of birth control. If you plan to become pregnant, see your health care provider for a prepregnancy visit.  If told by your health care provider, take low-dose aspirin daily starting at age 57.  Find healthy ways to cope with stress, such as: ? Meditation, yoga, or listening to music. ? Journaling. ? Talking to a trusted person. ? Spending time with friends and family. Safety  Always wear your seat belt while driving or riding in a vehicle.  Do not drive: ? If you have been drinking alcohol. Do not ride with someone who has been drinking. ? When you are tired or distracted. ? While texting.  Wear a helmet and other protective equipment during sports activities.  If you have firearms in your house, make sure you follow all gun safety procedures. What's next?  Visit your health care provider once a year for an annual wellness visit.  Ask your health care provider how often you should have your eyes and teeth checked.  Stay up to date on all vaccines. This information is not intended to replace advice given to you by your health care provider. Make sure you discuss any questions you have with your health care provider. Document Revised: 11/01/2019 Document Reviewed: 10/08/2017 Elsevier Patient Education  2021 [place] Breast self-awareness is knowing how your breasts look and feel. Doing breast self-awareness is important. It allows you to catch a breast problem early while it is still small and can be treated. All women should do breast self-awareness, including women who have had breast implants. Tell your doctor if you notice a change in your breasts. What you need:  A mirror.  A well-lit room. How to do a breast self-exam A breast self-exam is one way to learn what is normal for your breasts and to check for changes. To do a breast self-exam: Look for changes 1. Take off all the clothes above your waist. 2. Stand in front of a mirror in a room with good lighting. 3. Put your hands on your hips. 4. Push your hands down. 5. Look at your breasts and nipples in the mirror to see if one breast or nipple looks different from the other. Check to see if: ? The shape of one breast is different. ? The size of  one breast is different. ? There are wrinkles, dips, and bumps in one breast and not the other. 6. Look at each breast for changes in the skin, such as: ? Redness. ? Scaly areas. 7. Look for changes in your nipples, such as: ? Liquid around the nipples. ? Bleeding. ? Dimpling. ? Redness. ? A change in where the nipples are.   Feel for changes 1. Lie on your back on the floor. 2. Feel each breast. To do this, follow these steps: ? Pick a breast to feel. ? Put the arm closest to that breast above your head. ? Use your other arm to feel the nipple area of your breast. Feel the area with the pads of your three middle fingers by making small circles with your fingers. For the first circle, press lightly. For the second circle, press harder. For the third circle, press even harder. ? Keep making circles with your fingers at the different pressures as you move down your breast. Stop when you feel your ribs. ? Move your fingers a little toward the center of your body. ? Start making  circles with your fingers again, this time going up until you reach your collarbone. ? Keep making up-and-down circles until you reach your armpit. Remember to keep using the three pressures. ? Feel the other breast in the same way. 3. Sit or stand in the tub or shower. 4. With soapy water on your skin, feel each breast the same way you did in step 2 when you were lying on the floor.   Write down what you find Writing down what you find can help you remember what to tell your doctor. Write down:  What is normal for each breast.  Any changes you find in each breast, including: ? The kind of changes you find. ? Whether you have pain. ? Size and location of any lumps.  When you last had your menstrual period. General tips  Check your breasts every month.  If you are breastfeeding, the best time to check your breasts is after you feed your baby or after you use a breast pump.  If you get menstrual periods, the best time to check your breasts is 5-7 days after your menstrual period is over.  With time, you will become comfortable with the self-exam, and you will begin to know if there are changes in your breasts. Contact a doctor if you:  See a change in the shape or size of your breasts or nipples.  See a change in the skin of your breast or nipples, such as red or scaly skin.  Have fluid coming from your nipples that is not normal.  Find a lump or thick area that was not there before.  Have pain in your breasts.  Have any concerns about your breast health. Summary  Breast self-awareness includes looking for changes in your breasts, as well as feeling for changes within your breasts.  Breast self-awareness should be done in front of a mirror in a well-lit room.  You should check your breasts every month. If you get menstrual periods, the best time to check your breasts is 5-7 days after your menstrual period is over.  Let your doctor know of any changes you see in your  breasts, including changes in size, changes on the skin, pain or tenderness, or fluid from your nipples that is not normal. This information is not intended to replace advice given to you by your health care provider. Make sure  you discuss any questions you have with your health care provider. Document Revised: 09/15/2017 Document Reviewed: 09/15/2017 Elsevier Patient Education  [place].

## 2020-05-09 NOTE — Progress Notes (Signed)
ANNUAL PREVENTATIVE CARE GYNECOLOGY  ENCOUNTER NOTE  Subjective:       Michelle Dunlap is a 55 y.o. [phone] female here for a routine annual gynecologic exam. The patient is sexually active. The patient has never taken hormone replacement therapy. Patient denies post-menopausal vaginal bleeding. The patient wears seatbelts: yes. The patient participates in regular exercise: no. Has the patient ever been transfused or tattooed?: no.   Current complaints: 1.  Still noting hot flushes, but not as bad as they have been. Is managing symptoms.  2. Reports mother was recently diagnosed with colon cancer. Mother is 69 years old.    Gynecologic History No LMP recorded. Patient is perimenopausal. Contraception: post menopausal status and tubal ligation Last Pap: 05/04/2019. Results were: normal Last mammogram: 01/21/2017. Results were: normal Last Colonoscopy: Patient has never had one. Has Cologuard performed last year, was negative. Previously was performing FOBT yearly.   Last Dexa Scan: Patient has never had done.    Obstetric History OB History  Gravida Para Term Preterm AB Living  3 2 2   1 2   SAB IAB Ectopic Multiple Live Births  1       2    # Outcome Date GA Lbr Len/2nd Weight Sex Delivery Anes PTL Lv  3 Term 1997   8 lb 14.4 oz (4.037 kg) M Vag-Spont   LIV  2 Term 1993   8 lb 14.4 oz (4.037 kg) M Vag-Spont   LIV  1 SAB 1992            Past Medical History:  Diagnosis Date  . Depression    03/2000- stable on meds- 01/2014- fluoxetine rx effective 20mg  qd  . History of UTI   . Increased BMI   . Perimenopausal     Family History  Problem Relation Age of Onset  . Mental retardation Sister        passed away 79  . Colon cancer Mother 51  . Healthy Father   . Diabetes Neg Hx   . Heart disease Neg Hx     Past Surgical History:  Procedure Laterality Date  . DILATION AND CURETTAGE OF UTERUS  1992   missed ab  . TUBAL LIGATION  1998    Social History    Socioeconomic History  . Marital status: Married    Spouse name: Not on file  . Number of children: Not on file  . Years of education: Not on file  . Highest education level: Not on file  Occupational History  . Not on file  Tobacco Use  . Smoking status: Never Smoker  . Smokeless tobacco: Never Used  Vaping Use  . Vaping Use: Never used  Substance and Sexual Activity  . Alcohol use: No  . Drug use: No  . Sexual activity: Yes    Birth control/protection: Surgical  Other Topics Concern  . Not on file  Social History Narrative  . Not on file   Social Determinants of Health   Financial Resource Strain: Not on file  Food Insecurity: Not on file  Transportation Needs: Not on file  Physical Activity: Not on file  Stress: Not on file  Social Connections: Not on file  Intimate Partner Violence: Not on file    Current Outpatient Medications on File Prior to Visit  Medication Sig Dispense Refill  . Ascorbic Acid (VITAMIN C) 100 MG tablet Take 100 mg by mouth daily.    . calcium carbonate (OS-CAL) 600 MG TABS tablet Take  600 mg by mouth 2 (two) times daily with a meal.    . cholecalciferol (VITAMIN D) 1000 UNITS tablet Take 1,000 Units by mouth daily.    . Ferrous Sulfate (IRON) 325 (65 Fe) MG TABS Take by mouth.    Marland Kitchen FLUoxetine (PROZAC) 20 MG capsule TAKE 1 CAPSULE BY MOUTH EVERY DAY 7 capsule 0  . Magnesium Salicylate 325 MG TABS Take by mouth 2 (two) times daily.     No current facility-administered medications on file prior to visit.    No Known Allergies    Review of Systems ROS Review of Systems - General ROS: negative for - chills, fatigue, fever,, weight gain or weight loss.  Positive for hot flashes, night sweats (see HPI). Psychological ROS: negative for - , decreased libido, mood swings, physical abuse or sexual abuse.  Ophthalmic ROS: negative for - blurry vision, eye pain or loss of vision ENT ROS: negative for - headaches, hearing change, visual changes  or vocal changes Allergy and Immunology ROS: negative for - hives, itchy/watery eyes or seasonal allergies Hematological and Lymphatic ROS: negative for - bleeding problems, bruising, swollen lymph nodes or weight loss Endocrine ROS: negative for - galactorrhea, hair pattern changes, hot flashes, malaise/lethargy, mood swings, palpitations, polydipsia/polyuria, skin changes, temperature intolerance or unexpected weight changes Breast ROS: negative for - new or changing breast lumps or nipple discharge Respiratory ROS: negative for - cough or shortness of breath Cardiovascular ROS: negative for - chest pain, irregular heartbeat, palpitations or shortness of breath Gastrointestinal ROS: no abdominal pain, change in bowel habits, or black or bloody stools Genito-Urinary ROS: no dysuria, trouble voiding, or hematuria Musculoskeletal ROS: negative for - joint pain or joint stiffness Neurological ROS: negative for - bowel and bladder control changes Dermatological ROS: negative for rash and skin lesion changes   Objective:   BP 129/86   Pulse 76   Ht 5\' 5"  (1.651 m)   Wt 178 lb 14.4 oz (81.1 kg)   BMI 29.77 kg/m  CONSTITUTIONAL: Well-developed, well-nourished female in no acute distress.  PSYCHIATRIC: Normal mood and affect. Normal behavior. Normal judgment and thought content. NEUROLGIC: Alert and oriented to person, place, and time. Normal muscle tone coordination. No cranial nerve deficit noted. HENT:  Normocephalic, atraumatic, External right and left ear normal. Oropharynx is clear and moist EYES: Conjunctivae and EOM are normal. Pupils are equal, round, and reactive to light. No scleral icterus.  NECK: Normal range of motion, supple, no masses.  Normal thyroid.  SKIN: Skin is warm and dry. No rash noted. Not diaphoretic. No erythema. No pallor. CARDIOVASCULAR: Normal heart rate noted, regular rhythm, no murmur. RESPIRATORY: Clear to auscultation bilaterally. Effort and breath sounds  normal, no problems with respiration noted. BREASTS: Symmetric in size. No masses, skin changes, nipple drainage, or lymphadenopathy. ABDOMEN: Soft, normal bowel sounds, no distention noted.  No tenderness, rebound or guarding.  BLADDER: Normal PELVIC:  Bladder no bladder distension noted  Urethra: normal appearing urethra with no masses, tenderness or lesions  Vulva: normal appearing vulva with no masses, tenderness or lesions  Vagina: mildly atrophic but otherwise normal appearing vagina without discharge, no lesions and atrophic  Cervix: normal appearing cervix without discharge or lesions  Uterus: uterus is normal size, shape, consistency and nontender  Adnexa: normal adnexa in size, nontender and no masses  RV: External Exam NormaI, No Rectal Masses and Normal Sphincter tone  MUSCULOSKELETAL: Normal range of motion. No tenderness.  No cyanosis, clubbing, or edema.  2+ distal pulses.  LYMPHATIC: No Axillary, Supraclavicular, or Inguinal Adenopathy.   Depression screen [facility] 2/9 05/09/2020 05/04/2019  Decreased Interest 2 2  Down, Depressed, Hopeless 3 2  PHQ - 2 Score 5 4  Altered sleeping 3 3  Tired, decreased energy 2 3  Change in appetite 3 3  Feeling bad or failure about yourself  3 2  Trouble concentrating 2 2  Moving slowly or fidgety/restless 1 1  Suicidal thoughts 1 1  PHQ-9 Score 20 19  Difficult doing work/chores Somewhat difficult Somewhat difficult    GAD 7 : Generalized Anxiety Score 05/09/2020 05/04/2019  Nervous, Anxious, on Edge 1 2  Control/stop worrying 2 3  Worry too much - different things 2 3  Trouble relaxing 2 3  Restless 1 2  Easily annoyed or irritable 1 3  Afraid - awful might happen 1 2  Total GAD 7 Score 10 18  Anxiety Difficulty Somewhat difficult Somewhat difficult      Labs: Lab Results  Component Value Date   WBC 6.3 05/04/2019   HGB 14.1 05/04/2019   HCT 41.6 05/04/2019   MCV 93 05/04/2019   PLT 277 05/04/2019    Lab Results   Component Value Date   CREATININE 0.80 05/04/2019   BUN 18 05/04/2019   NA 141 05/04/2019   K 4.6 05/04/2019   CL 103 05/04/2019   CO2 24 05/04/2019    Lab Results  Component Value Date   ALT 8 05/04/2019   AST 18 05/04/2019   ALKPHOS 86 05/04/2019   BILITOT 0.2 05/04/2019    Lab Results  Component Value Date   CHOL 201 (H) 05/04/2019   HDL 54 05/04/2019   LDLCALC 121 (H) 05/04/2019   TRIG 145 05/04/2019   CHOLHDL 3.7 05/04/2019    Lab Results  Component Value Date   TSH 2.250 05/04/2019    Lab Results  Component Value Date   HGBA1C 5.8 (H) 05/04/2019     Assessment:   1. Encounter for well woman exam with routine gynecological exam   2. Breast cancer screening by mammogram   3. Family history of colon cancer in mother   4. Menopausal vasomotor syndrome   5. Anxiety and depression   6. Prediabetes   7. Dyslipidemia   8. Overweight (BMI 25.0-29.9)    Plan:  - Pap: Not done. Up to date.  - Mammogram: Ordered/  - Stool Guaiac Testing:  Up to date. Patient notes that when due, she will now have a colonoscopy due to her mother's new diagnosis of colon cancer.   - Labs: CBC, CMP, Lipids, Hemoglobin A1C. - Routine preventative health maintenance measures emphasized: Exercise/Diet/Weight control, Tobacco Warnings, Alcohol/Substance use risks, Stress Management and Peer Pressure Issues - Anxiety and depression currently on Fluoxetine.  PHQ-9 and Gad-7 scores elevated today. Would recommend increasing dosing of medication and adjunctive counseling.  - Patient with menopausal vasomotor symptoms, however notes that they have improved since last year.  Currently manageable with lifestyle interventions.- Flu vaccine up to date.  - Pre-diabetes and dyslipidemia previously managed by PCP. Currently withuot a PCP. Will follow up labs, and if mild, can manage with GYN.  Flu vaccine: declines COVID vaccination: declines.  - Return to Clinic - 1 Year   Hildred Laser, MD   [facility]

## 2020-05-09 NOTE — Progress Notes (Signed)
Pt present for annual exam. Pt stated that she was doing well. Pt stated needing labs completed today for her job. PHQ-9=20; GAD-7= 10.

## 2020-05-10 LAB — LIPID PANEL
Chol/HDL Ratio: 4.1 ratio (ref 0.0–4.4)
Cholesterol, Total: 207 mg/dL — ABNORMAL HIGH (ref 100–199)
HDL: 50 mg/dL (ref >39)
LDL Chol Calc (NIH): 129 mg/dL — ABNORMAL HIGH (ref 0–99)
Triglycerides: 156 mg/dL — ABNORMAL HIGH (ref 0–149)
VLDL Cholesterol Cal: 28 mg/dL (ref 5–40)

## 2020-05-10 LAB — COMPREHENSIVE METABOLIC PANEL
ALT: 10 IU/L (ref 0–32)
AST: 20 IU/L (ref 0–40)
Albumin/Globulin Ratio: 2.2 (ref 1.2–2.2)
Albumin: 4.6 g/dL (ref 3.8–4.9)
Alkaline Phosphatase: 96 IU/L (ref 44–121)
BUN/Creatinine Ratio: 22 (ref 9–23)
BUN: 13 mg/dL (ref 6–24)
Bilirubin Total: 0.2 mg/dL (ref 0.0–1.2)
CO2: 22 mmol/L (ref 20–29)
Calcium: 9.8 mg/dL (ref 8.7–10.2)
Chloride: 105 mmol/L (ref 96–106)
Creatinine, Ser: 0.6 mg/dL (ref 0.57–1.00)
Globulin, Total: 2.1 g/dL (ref 1.5–4.5)
Glucose: 96 mg/dL (ref 65–99)
Potassium: 4.8 mmol/L (ref 3.5–5.2)
Sodium: 144 mmol/L (ref 134–144)
Total Protein: 6.7 g/dL (ref 6.0–8.5)
eGFR: 105 mL/min/{1.73_m2} (ref >59)

## 2020-05-10 LAB — CBC
Hematocrit: 42.3 % (ref 34.0–46.6)
Hemoglobin: 14.3 g/dL (ref 11.1–15.9)
MCH: 30.3 pg (ref 26.6–33.0)
MCHC: 33.8 g/dL (ref 31.5–35.7)
MCV: 90 fL (ref 79–97)
Platelets: 274 10*3/uL (ref 150–450)
RBC: 4.72 x10E6/uL (ref 3.77–5.28)
RDW: 12.7 % (ref 11.7–15.4)
WBC: 6 10*3/uL (ref 3.4–10.8)

## 2020-05-10 LAB — HEMOGLOBIN A1C
Est. average glucose Bld gHb Est-mCnc: 120 mg/dL
Hgb A1c MFr Bld: 5.8 % — ABNORMAL HIGH (ref 4.8–5.6)

## 2020-05-12 ENCOUNTER — Encounter: Payer: Self-pay | Admitting: Obstetrics and Gynecology

## 2020-06-19 ENCOUNTER — Ambulatory Visit
Admission: RE | Admit: 2020-06-19 | Discharge: 2020-06-19 | Disposition: A | Discharge status: Home / Self Care | Payer: No Typology Code available for payment source | Source: Ambulatory Visit | Attending: Obstetrics and Gynecology | Admitting: Obstetrics and Gynecology

## 2020-06-19 ENCOUNTER — Other Ambulatory Visit: Payer: Self-pay

## 2020-06-19 DIAGNOSIS — Z1231 Encounter for screening mammogram for malignant neoplasm of breast: Secondary | ICD-10-CM | POA: Insufficient documentation

## 2021-02-28 ENCOUNTER — Other Ambulatory Visit: Payer: Self-pay | Admitting: Obstetrics and Gynecology

## 2021-05-08 NOTE — Progress Notes (Deleted)
? ?ANNUAL PREVENTATIVE CARE GYNECOLOGY  ENCOUNTER NOTE ? ?Subjective:  ?    ? Michelle Dunlap is a 56 y.o. [phone] female here for a routine annual gynecologic exam. The patient is sexually active. The patient has never taken hormone replacement therapy. Patient denies post-menopausal vaginal bleeding. The patient wears seatbelts: yes. The patient participates in regular exercise: no. Has the patient ever been transfused or tattooed?: no.  ? ?Current complaints: ?1.  ***  ?  ?Gynecologic History ?Patient's last menstrual period was 01/11/2018. ?Contraception: post menopausal status and tubal ligation ?Last Pap: 05/04/2019. Results were: normal ?Last mammogram: 06/19/2020. Results were: normal ?Last Colonoscopy: 05/17/2013 Results were: Every 10 years ?Last Dexa Scan: Never done ? ? ?Obstetric History ?OB History  ?Gravida Para Term Preterm AB Living  ?3 2 2   1 2   ?SAB IAB Ectopic Multiple Live Births  ?1       2  ?  ?# Outcome Date GA Lbr Len/2nd Weight Sex Delivery Anes PTL Lv  ?3 Term 1997   8 lb 14.4 oz (4.037 kg) M Vag-Spont   LIV  ?2 Term 1993   8 lb 14.4 oz (4.037 kg) M Vag-Spont   LIV  ?1 SAB 1992          ? ? ?Past Medical History:  ?Diagnosis Date  ? Depression   ? 03/2000- stable on meds- 01/2014- fluoxetine rx effective 20mg  qd  ? History of UTI   ? Increased BMI   ? Perimenopausal   ? PMDD (premenstrual dysphoric disorder) 12/07/2014  ? ? ?Family History  ?Problem Relation Age of Onset  ? Mental retardation Sister   ?     passed away 1993  ? Colon cancer Mother 37  ? Healthy Father   ? Diabetes Neg Hx   ? Heart disease Neg Hx   ? Breast cancer Neg Hx   ? ? ?Past Surgical History:  ?Procedure Laterality Date  ? DILATION AND CURETTAGE OF UTERUS  1992  ? missed ab  ? TUBAL LIGATION  1998  ? ? ?Social History  ? ?Socioeconomic History  ? Marital status: Married  ?  Spouse name: Not on file  ? Number of children: Not on file  ? Years of education: Not on file  ? Highest education level: Not on file   ?Occupational History  ? Not on file  ?Tobacco Use  ? Smoking status: Never  ? Smokeless tobacco: Never  ?Vaping Use  ? Vaping Use: Never used  ?Substance and Sexual Activity  ? Alcohol use: No  ? Drug use: No  ? Sexual activity: Yes  ?  Birth control/protection: Surgical  ?Other Topics Concern  ? Not on file  ?Social History Narrative  ? Not on file  ? ?Social Determinants of Health  ? ?Financial Resource Strain: Not on file  ?Food Insecurity: Not on file  ?Transportation Needs: Not on file  ?Physical Activity: Not on file  ?Stress: Not on file  ?Social Connections: Not on file  ?Intimate Partner Violence: Not on file  ? ? ?Current Outpatient Medications on File Prior to Visit  ?Medication Sig Dispense Refill  ? Ascorbic Acid (VITAMIN C) 100 MG tablet Take 100 mg by mouth daily.    ? calcium carbonate (OS-CAL) 600 MG TABS tablet Take 600 mg by mouth 2 (two) times daily with a meal.    ? cholecalciferol (VITAMIN D) 1000 UNITS tablet Take 1,000 Units by mouth daily.    ? Ferrous Sulfate (IRON)  325 (65 Fe) MG TABS Take by mouth.    ? FLUoxetine (PROZAC) 20 MG capsule TAKE 1 CAPSULE BY MOUTH EVERY DAY 90 capsule 3  ? Magnesium Salicylate 325 MG TABS Take by mouth 2 (two) times daily.    ? ?No current facility-administered medications on file prior to visit.  ? ? ?No Known Allergies ? ? ? ?Review of Systems ?ROS ?Review of Systems - General ROS: negative for - chills, fatigue, fever, hot flashes, night sweats, weight gain or weight loss ?Psychological ROS: negative for - anxiety, decreased libido, depression, mood swings, physical abuse or sexual abuse ?Ophthalmic ROS: negative for - blurry vision, eye pain or loss of vision ?ENT ROS: negative for - headaches, hearing change, visual changes or vocal changes ?Allergy and Immunology ROS: negative for - hives, itchy/watery eyes or seasonal allergies ?Hematological and Lymphatic ROS: negative for - bleeding problems, bruising, swollen lymph nodes or weight loss ?Endocrine  ROS: negative for - galactorrhea, hair pattern changes, hot flashes, malaise/lethargy, mood swings, palpitations, polydipsia/polyuria, skin changes, temperature intolerance or unexpected weight changes ?Breast ROS: negative for - new or changing breast lumps or nipple discharge ?Respiratory ROS: negative for - cough or shortness of breath ?Cardiovascular ROS: negative for - chest pain, irregular heartbeat, palpitations or shortness of breath ?Gastrointestinal ROS: no abdominal pain, change in bowel habits, or black or bloody stools ?Genito-Urinary ROS: no dysuria, trouble voiding, or hematuria ?Musculoskeletal ROS: negative for - joint pain or joint stiffness ?Neurological ROS: negative for - bowel and bladder control changes ?Dermatological ROS: negative for rash and skin lesion changes ?  ?Objective:  ? ?LMP 01/11/2018  ?CONSTITUTIONAL: Well-developed, well-nourished female in no acute distress.  ?PSYCHIATRIC: Normal mood and affect. Normal behavior. Normal judgment and thought content. ?NEUROLGIC: Alert and oriented to person, place, and time. Normal muscle tone coordination. No cranial nerve deficit noted. ?HENT:  Normocephalic, atraumatic, External right and left ear normal. Oropharynx is clear and moist ?EYES: Conjunctivae and EOM are normal. Pupils are equal, round, and reactive to light. No scleral icterus.  ?NECK: Normal range of motion, supple, no masses.  Normal thyroid.  ?SKIN: Skin is warm and dry. No rash noted. Not diaphoretic. No erythema. No pallor. ?CARDIOVASCULAR: Normal heart rate noted, regular rhythm, no murmur. ?RESPIRATORY: Clear to auscultation bilaterally. Effort and breath sounds normal, no problems with respiration noted. ?BREASTS: Symmetric in size. No masses, skin changes, nipple drainage, or lymphadenopathy. ?ABDOMEN: Soft, normal bowel sounds, no distention noted.  No tenderness, rebound or guarding.  ?BLADDER: Normal ?PELVIC: ? Bladder {:311640} ? Urethra: {:311719} ? Vulva:  {:311722} ? Vagina: {:311643} ? Cervix: {:311644} ? Uterus: {:311718} ? Adnexa: {:311645} ? RV: {Blank multiple:19196::"External Exam NormaI","No Rectal Masses","Normal Sphincter tone"}  ?MUSCULOSKELETAL: Normal range of motion. No tenderness.  No cyanosis, clubbing, or edema.  2+ distal pulses. ?LYMPHATIC: No Axillary, Supraclavicular, or Inguinal Adenopathy. ? ? ?Labs: ?Lab Results  ?Component Value Date  ? WBC 6.0 05/09/2020  ? HGB 14.3 05/09/2020  ? HCT 42.3 05/09/2020  ? MCV 90 05/09/2020  ? PLT 274 05/09/2020  ? ? ?Lab Results  ?Component Value Date  ? CREATININE 0.60 05/09/2020  ? BUN 13 05/09/2020  ? NA 144 05/09/2020  ? K 4.8 05/09/2020  ? CL 105 05/09/2020  ? CO2 22 05/09/2020  ? ? ?Lab Results  ?Component Value Date  ? ALT 10 05/09/2020  ? AST 20 05/09/2020  ? ALKPHOS 96 05/09/2020  ? BILITOT 0.2 05/09/2020  ? ? ?Lab Results  ?Component Value  Date  ? CHOL 207 (H) 05/09/2020  ? HDL 50 05/09/2020  ? LDLCALC 129 (H) 05/09/2020  ? TRIG 156 (H) 05/09/2020  ? CHOLHDL 4.1 05/09/2020  ? ? ?Lab Results  ?Component Value Date  ? TSH 2.250 05/04/2019  ? ? ?Lab Results  ?Component Value Date  ? HGBA1C 5.8 (H) 05/09/2020  ? ? ? ?Assessment:  ? ?Annual gynecologic examination 56 y.o. ?Contraception: post menopausal status and tubal ligation ?{Blank multiple:19196::"Normal BMI","Overweight","Obesity 1","Obesity 2"} ?Problem List Items Addressed This Visit   ?None ? ? ?Plan:  ?Pap:  UTD ?Mammogram: Ordered ?{Blank multiple:19196::"Lipid 1","FBS","TSH","Hemoglobin A1C","Vit D Level""***"} ?Routine preventative health maintenance measures emphasized: Exercise/Diet/Weight control, Tobacco Warnings, Alcohol/Substance use risks, Stress Management, Peer Pressure Issues, and Safe Sex ?COVID Vaccination status: ?Return to Clinic - 1 Year ? ? ? ?Hildred Laser, MD ?[facility] ? ? ? ? ? ? ? ? ? ? ? ? ? ?  ?

## 2021-05-09 ENCOUNTER — Telehealth: Payer: Self-pay | Admitting: Obstetrics and Gynecology

## 2021-05-09 NOTE — Telephone Encounter (Signed)
Called patient to inform her that a year supply of Prozac was sent to her pharmacy and to call and request refill from them. She verbalized understanding. ?

## 2021-05-09 NOTE — Telephone Encounter (Signed)
Rescheduled physical due to +Covid PT is asking for a refill of FLUoxetine ( she will run out prior to her rescheduled appt ?

## 2021-05-10 ENCOUNTER — Encounter: Payer: No Typology Code available for payment source | Admitting: Obstetrics and Gynecology

## 2021-05-15 ENCOUNTER — Ambulatory Visit (INDEPENDENT_AMBULATORY_CARE_PROVIDER_SITE_OTHER): Payer: No Typology Code available for payment source | Admitting: Obstetrics and Gynecology

## 2021-05-15 VITALS — BP 126/91 | HR 83 | Resp 16 | Ht 65.0 in | Wt 176.9 lb

## 2021-05-15 DIAGNOSIS — N951 Menopausal and female climacteric states: Secondary | ICD-10-CM

## 2021-05-15 DIAGNOSIS — Z1231 Encounter for screening mammogram for malignant neoplasm of breast: Secondary | ICD-10-CM

## 2021-05-15 DIAGNOSIS — R7303 Prediabetes: Secondary | ICD-10-CM

## 2021-05-15 DIAGNOSIS — Z124 Encounter for screening for malignant neoplasm of cervix: Secondary | ICD-10-CM

## 2021-05-15 DIAGNOSIS — Z01419 Encounter for gynecological examination (general) (routine) without abnormal findings: Secondary | ICD-10-CM

## 2021-05-15 DIAGNOSIS — F419 Anxiety disorder, unspecified: Secondary | ICD-10-CM

## 2021-05-15 DIAGNOSIS — E785 Hyperlipidemia, unspecified: Secondary | ICD-10-CM

## 2021-05-15 DIAGNOSIS — Z8616 Personal history of COVID-19: Secondary | ICD-10-CM

## 2021-05-15 DIAGNOSIS — E663 Overweight: Secondary | ICD-10-CM

## 2021-05-15 DIAGNOSIS — F32A Depression, unspecified: Secondary | ICD-10-CM

## 2021-05-15 MED ORDER — FLUOXETINE HCL 40 MG PO CAPS: 40.0000 mg | ORAL_CAPSULE | Freq: Every day | ORAL | 3 refills | Status: DC

## 2021-05-15 NOTE — Progress Notes (Signed)
? ?ANNUAL PREVENTATIVE CARE GYNECOLOGY  ENCOUNTER NOTE ? ?Subjective:  ?    ? Michelle Dunlap is a 56 y.o. [phone] female here for a routine annual gynecologic exam. The patient is sexually active. The patient has never taken hormone replacement therapy. Patient denies post-menopausal vaginal bleeding. The patient wears seatbelts: yes. The patient participates in regular exercise: no. Has the patient ever been transfused or tattooed?: no.  ? ?Current complaints: ?1.  She wants to increase her dose of Fluoxetine. Currently on 20 mg. Is noting an increase in her symptoms.  ?2.  Reports recent COVID infection ~ 1 week ago. [place] mild residual symptoms.  ?  ?Gynecologic History ?Patient's last menstrual period was 01/11/2018. ?Contraception: post menopausal status ?Last Pap: 05/04/2019. Results were: normal ?Last mammogram: 06/19/2020. Results were: normal ?Last Colonoscopy: 05/17/2013 Due in : 10 years ?Last Dexa Scan: never had one.   ? ? ?Obstetric History ?OB History  ?Gravida Para Term Preterm AB Living  ?3 2 2   1 2   ?SAB IAB Ectopic Multiple Live Births  ?1       2  ?  ?# Outcome Date GA Lbr Len/2nd Weight Sex Delivery Anes PTL Lv  ?3 Term 1997   8 lb 14.4 oz (4.037 kg) M Vag-Spont   LIV  ?2 Term 1993   8 lb 14.4 oz (4.037 kg) M Vag-Spont   LIV  ?1 SAB 1992          ? ? ?Past Medical History:  ?Diagnosis Date  ? Depression   ? 03/2000- stable on meds- 01/2014- fluoxetine rx effective 20mg  qd  ? History of UTI   ? Increased BMI   ? Perimenopausal   ? PMDD (premenstrual dysphoric disorder) 12/07/2014  ? ? ?Family History  ?Problem Relation Age of Onset  ? Mental retardation Sister   ?     passed away 1993  ? Colon cancer Mother 71  ? Healthy Father   ? Diabetes Neg Hx   ? Heart disease Neg Hx   ? Breast cancer Neg Hx   ? ? ?Past Surgical History:  ?Procedure Laterality Date  ? DILATION AND CURETTAGE OF UTERUS  1992  ? missed ab  ? TUBAL LIGATION  1998  ? ? ?Social History  ? ?Socioeconomic History  ? Marital status:  Married  ?  Spouse name: Not on file  ? Number of children: Not on file  ? Years of education: Not on file  ? Highest education level: Not on file  ?Occupational History  ? Not on file  ?Tobacco Use  ? Smoking status: Never  ? Smokeless tobacco: Never  ?Vaping Use  ? Vaping Use: Never used  ?Substance and Sexual Activity  ? Alcohol use: No  ? Drug use: No  ? Sexual activity: Yes  ?  Birth control/protection: Surgical  ?Other Topics Concern  ? Not on file  ?Social History Narrative  ? Not on file  ? ?Social Determinants of Health  ? ?Financial Resource Strain: Not on file  ?Food Insecurity: Not on file  ?Transportation Needs: Not on file  ?Physical Activity: Not on file  ?Stress: Not on file  ?Social Connections: Not on file  ?Intimate Partner Violence: Not on file  ? ? ?Current Outpatient Medications on File Prior to Visit  ?Medication Sig Dispense Refill  ? Ascorbic Acid (VITAMIN C) 100 MG tablet Take 100 mg by mouth daily.    ? calcium carbonate (OS-CAL) 600 MG TABS tablet Take 600  mg by mouth 2 (two) times daily with a meal.    ? cholecalciferol (VITAMIN D) 1000 UNITS tablet Take 1,000 Units by mouth daily.    ? Ferrous Sulfate (IRON) 325 (65 Fe) MG TABS Take by mouth.    ? FLUoxetine (PROZAC) 20 MG capsule TAKE 1 CAPSULE BY MOUTH EVERY DAY 90 capsule 3  ? Magnesium Salicylate 325 MG TABS Take by mouth 2 (two) times daily.    ? ?No current facility-administered medications on file prior to visit.  ? ? ?No Known Allergies ? ? ? ?Review of Systems ?ROS ?Review of Systems - General ROS: negative for - chills, fatigue, fever, hot flashes, night sweats, weight gain or weight loss ?Psychological ROS: negative for - anxiety, decreased libido, depression, mood swings, physical abuse or sexual abuse ?Ophthalmic ROS: negative for - blurry vision, eye pain or loss of vision ?ENT ROS: negative for - headaches, hearing change, visual changes or vocal changes ?Allergy and Immunology ROS: negative for - hives, itchy/watery eyes  or seasonal allergies ?Hematological and Lymphatic ROS: negative for - bleeding problems, bruising, swollen lymph nodes or weight loss ?Endocrine ROS: negative for - galactorrhea, hair pattern changes, hot flashes, malaise/lethargy, mood swings, palpitations, polydipsia/polyuria, skin changes, temperature intolerance or unexpected weight changes ?Breast ROS: negative for - new or changing breast lumps or nipple discharge ?Respiratory ROS: negative for - cough or shortness of breath ?Cardiovascular ROS: negative for - chest pain, irregular heartbeat, palpitations or shortness of breath ?Gastrointestinal ROS: no abdominal pain, change in bowel habits, or black or bloody stools ?Genito-Urinary ROS: no dysuria, trouble voiding, or hematuria ?Musculoskeletal ROS: negative for - joint pain or joint stiffness ?Neurological ROS: negative for - bowel and bladder control changes ?Dermatological ROS: negative for rash and skin lesion changes ?  ?Objective:  ? ?BP (!) 126/91   Pulse 83   Resp 16   Ht 5\' 5"  (1.651 m)   Wt 176 lb 14.4 oz (80.2 kg)   LMP 01/11/2018   BMI 29.44 kg/m?  ?CONSTITUTIONAL: Well-developed, well-nourished female in no acute distress.  ?PSYCHIATRIC: Normal mood and affect. Normal behavior. Normal judgment and thought content. ?NEUROLGIC: Alert and oriented to person, place, and time. Normal muscle tone coordination. No cranial nerve deficit noted. ?HENT:  Normocephalic, atraumatic, External right and left ear normal. Oropharynx is clear and moist ?EYES: Conjunctivae and EOM are normal. Pupils are equal, round, and reactive to light. No scleral icterus.  ?NECK: Normal range of motion, supple, no masses.  Normal thyroid.  ?SKIN: Skin is warm and dry. No rash noted. Not diaphoretic. No erythema. No pallor. ?CARDIOVASCULAR: Normal heart rate noted, regular rhythm, no murmur. ?RESPIRATORY: Clear to auscultation bilaterally. Effort and breath sounds normal, no problems with respiration noted. ?BREASTS:  Symmetric in size. No masses, skin changes, nipple drainage, or lymphadenopathy. ?ABDOMEN: Soft, normal bowel sounds, no distention noted.  No tenderness, rebound or guarding.  ?BLADDER: Normal ?PELVIC: ? Bladder no bladder distension noted ?            Urethra: normal appearing urethra with no masses, tenderness or lesions ?            Vulva: normal appearing vulva with no masses, tenderness or lesions ?            Vagina: mildly atrophic but otherwise normal appearing vagina without discharge, no lesions and atrophic ?            Cervix: normal appearing cervix without discharge or lesions ?  Uterus: uterus is normal size, shape, consistency and nontender ?            Adnexa: normal adnexa in size, nontender and no masses ?            RV: External Exam NormaI, No Rectal Masses and Normal Sphincter tone  ?MUSCULOSKELETAL: Normal range of motion. No tenderness.  No cyanosis, clubbing, or edema.  2+ distal pulses. ?LYMPHATIC: No Axillary, Supraclavicular, or Inguinal Adenopathy. ? ? ? ?  05/15/2021  ?  2:14 PM 05/09/2020  ? 10:12 AM 05/04/2019  ?  9:23 AM  ?Depression screen PHQ 2/9  ?Decreased Interest 3 2 2   ?Down, Depressed, Hopeless 3 3 2   ?PHQ - 2 Score 6 5 4   ?Altered sleeping 3 3 3   ?Tired, decreased energy 3 2 3   ?Change in appetite 3 3 3   ?Feeling bad or failure about yourself  3 3 2   ?Trouble concentrating 2 2 2   ?Moving slowly or fidgety/restless 3 1 1   ?Suicidal thoughts 3 1 1   ?PHQ-9 Score 26 20 19   ?Difficult doing work/chores Very difficult Somewhat difficult Somewhat difficult  ? ? ? ?  05/15/2021  ?  2:15 PM 05/09/2020  ? 10:13 AM 05/04/2019  ?  9:21 AM  ?GAD 7 : Generalized Anxiety Score  ?Nervous, Anxious, on Edge 2 1 2   ?Control/stop worrying 3 2 3   ?Worry too much - different things 3 2 3   ?Trouble relaxing 2 2 3   ?Restless 2 1 2   ?Easily annoyed or irritable 3 1 3   ?Afraid - awful might happen 3 1 2   ?Total GAD 7 Score 18 10 18   ?Anxiety Difficulty Very difficult Somewhat difficult Somewhat  difficult  ? ? ? ? ? ?Labs: ?Lab Results  ?Component Value Date  ? WBC 6.0 05/09/2020  ? HGB 14.3 05/09/2020  ? HCT 42.3 05/09/2020  ? MCV 90 05/09/2020  ? PLT 274 05/09/2020  ? ? ?Lab Results  ?Component Valu

## 2021-05-15 NOTE — Progress Notes (Deleted)
? ?[facility] PREVENTATIVE CARE GYNECOLOGY  ENCOUNTER Dunlap ? ?Subjective:  ?    ? [name] is a 56 y.o. [phone] female here for a routine Michelle gynecologic exam. The patient is sexually active. The patient has never taken hormone replacement therapy. Patient denies post-menopausal vaginal bleeding. The patient wears seatbelts: yes. The patient participates in regular exercise: no. Has the patient ever been transfused or tattooed?: no.  ? ? ?Current complaints: ?1.  Notes some home and life stressors but otherwise ok. Would like to increase her Prozac.  ?2.  Reports hot flushes have improved some.  ?3. Reports being diagnosed with COVID last week.  Had moderate symptoms.  ? ? ?Gynecologic History ?Patient's last menstrual period was 01/11/2018. ?Contraception: post menopausal status and tubal ligation ?Last Pap: 05/04/2019. Results were: normal ?Last mammogram: 06/19/2020. Results were: normal ?Last Colonoscopy: Patient has never had one. Has Cologuard performed in 2021, was negative. Previously was performing FOBT yearly.  ? Last Dexa Scan: Patient has never had done.  ? ? ?Obstetric History ?OB History  ?Gravida Para Term Preterm AB Living  ?3 2 2   1 2   ?SAB IAB Ectopic Multiple Live Births  ?1       2  ?  ?# Outcome Date GA Lbr Len/2nd Weight Sex Delivery Anes PTL Lv  ?3 Term 1997   8 lb 14.4 oz (4.037 kg) M Vag-Spont   LIV  ?2 Term 1993   8 lb 14.4 oz (4.037 kg) M Vag-Spont   LIV  ?1 SAB 1992          ? ? ?Past Medical History:  ?Diagnosis Date  ? Depression   ? 03/2000- stable on meds- 01/2014- fluoxetine rx effective 20mg  qd  ? History of UTI   ? Increased BMI   ? Perimenopausal   ? PMDD (premenstrual dysphoric disorder) 12/07/2014  ? ? ?Family History  ?Problem Relation Age of Onset  ? Mental retardation Sister   ?     passed away 1993  ? Colon cancer Mother 85  ? Healthy Father   ? Diabetes Neg Hx   ? Heart disease Neg Hx   ? Breast cancer Neg Hx   ? ? ?Past Surgical History:  ?Procedure Laterality Date  ?  DILATION AND CURETTAGE OF UTERUS  1992  ? missed ab  ? [place]  ? ? ?Social History  ? ?Socioeconomic History  ? Marital status: Married  ?  Spouse name: Not on file  ? Number of children: Not on file  ? Years of education: Not on file  ? Highest education level: Not on file  ?Occupational History  ? Not on file  ?Tobacco Use  ? Smoking status: Never  ? Smokeless tobacco: Never  ?Vaping Use  ? Vaping Use: Never used  ?Substance and Sexual Activity  ? Alcohol use: No  ? Drug use: No  ? Sexual activity: Yes  ?  Birth control/protection: Surgical  ?Other Topics Concern  ? Not on file  ?Social History Narrative  ? Not on file  ? ?Social Determinants of Health  ? ?Financial Resource Strain: Not on file  ?Food Insecurity: Not on file  ?Transportation Needs: Not on file  ?Physical Activity: Not on file  ?Stress: Not on file  ?Social Connections: Not on file  ?Intimate Partner Violence: Not on file  ? ? ?Current Outpatient Medications on File Prior to Visit  ?Medication Sig Dispense Refill  ? Ascorbic Acid (VITAMIN C) 100  MG tablet Take 100 mg by mouth daily.    ? calcium carbonate (OS-CAL) 600 MG TABS tablet Take 600 mg by mouth 2 (two) times daily with a meal.    ? cholecalciferol (VITAMIN D) 1000 UNITS tablet Take 1,000 Units by mouth daily.    ? Ferrous Sulfate (IRON) 325 (65 Fe) MG TABS Take by mouth.    ? FLUoxetine (PROZAC) 20 MG capsule TAKE 1 CAPSULE BY MOUTH EVERY DAY 90 capsule 3  ? Magnesium Salicylate [id number] MG TABS Take by mouth 2 (two) times daily.    ? ?No current facility-administered medications on file prior to visit.  ? ? ?No Known Allergies ? ? ? ?Review of Systems ?ROS ?Review of Systems - General ROS: negative for - chills, fatigue, fever,, weight gain or weight loss.   ?Psychological ROS: negative for - , decreased libido, mood swings, physical abuse or sexual abuse.  ?Ophthalmic ROS: negative for - blurry vision, eye pain or loss of vision ?ENT ROS: negative for - headaches, hearing  change, visual changes or vocal changes ?Allergy and Immunology ROS: negative for - hives, itchy/watery eyes or seasonal allergies ?Hematological and Lymphatic ROS: negative for - bleeding problems, bruising, swollen lymph nodes or weight loss ?Endocrine ROS: negative for - galactorrhea, hair pattern changes, hot flashes, malaise/lethargy, mood swings, palpitations, polydipsia/polyuria, skin changes, temperature intolerance or unexpected weight changes ?Breast ROS: negative for - new or changing breast lumps or nipple discharge ?Respiratory ROS: negative for - cough or shortness of breath ?Cardiovascular ROS: negative for - chest pain, irregular heartbeat, palpitations or shortness of breath ?Gastrointestinal ROS: no abdominal pain, change in bowel habits, or black or bloody stools ?Genito-Urinary ROS: no dysuria, trouble voiding, or hematuria ?Musculoskeletal ROS: negative for - joint pain or joint stiffness ?Neurological ROS: negative for - bowel and bladder control changes ?Dermatological ROS: negative for rash and skin lesion changes ?  ?Objective:  ? ?LMP 01/11/2018  ?CONSTITUTIONAL: Well-developed, well-nourished female in no acute distress.  ?PSYCHIATRIC: Normal mood and affect. Normal behavior. Normal judgment and thought content. ?[place]: Alert and oriented to person, place, and time. Normal muscle tone coordination. No cranial nerve deficit noted. ?HENT:  Normocephalic, atraumatic, External right and left ear normal. Oropharynx is clear and moist ?EYES: Conjunctivae and EOM are normal. Pupils are equal, round, and reactive to light. No scleral icterus.  ?NECK: Normal range of motion, supple, no masses.  Normal thyroid.  ?SKIN: Skin is warm and dry. No rash noted. Not diaphoretic. No erythema. No pallor. ?CARDIOVASCULAR: Normal heart rate noted, regular rhythm, no murmur. ?RESPIRATORY: Clear to auscultation bilaterally. Effort and breath sounds normal, no problems with respiration noted. ?BREASTS:  Symmetric in size. No masses, skin changes, nipple drainage, or lymphadenopathy. ?ABDOMEN: Soft, normal bowel sounds, no distention noted.  No tenderness, rebound or guarding.  ?BLADDER: Normal ?PELVIC: ? Bladder no bladder distension noted ? Urethra: normal appearing urethra with no masses, tenderness or lesions ? Vulva: normal appearing vulva with no masses, tenderness or lesions ? Vagina: mildly atrophic but otherwise normal appearing vagina without discharge, no lesions and atrophic ? Cervix: normal appearing cervix without discharge or lesions ? Uterus: uterus is normal size, shape, consistency and nontender ? Adnexa: normal adnexa in size, nontender and no masses ? RV: External Exam NormaI, No Rectal Masses and Normal Sphincter tone  ?MUSCULOSKELETAL: Normal range of motion. No tenderness.  No cyanosis, clubbing, or edema.  2+ distal pulses. ?LYMPHATIC: No Axillary, Supraclavicular, or Inguinal Adenopathy. ? ? ? ?  05/15/2021  ?  2:14 PM 05/09/2020  ? 10:12 AM 05/04/2019  ?  9:23 AM  ?Depression screen PHQ 2/9  ?Decreased Interest 3 2 2   ?Down, Depressed, Hopeless 3 3 2   ?PHQ - 2 Score 6 5 4   ?Altered sleeping 3 3 3   ?Tired, decreased energy 3 2 3   ?Change in appetite 3 3 3   ?Feeling bad or failure about yourself  3 3 2   ?Trouble concentrating 2 2 2   ?Moving slowly or fidgety/restless 3 1 1   ?Suicidal thoughts 3 1 1   ?PHQ-9 Score 26 20 19   ?Difficult doing work/chores Very difficult Somewhat difficult Somewhat difficult  ? ? ? ?  05/15/2021  ?  2:15 PM 05/09/2020  ? 10:13 AM 05/04/2019  ?  9:21 AM  ?GAD 7 : Generalized Anxiety Score  ?Nervous, Anxious, on Edge 2 1 2   ?Control/stop worrying 3 2 3   ?Worry too much - different things 3 2 3   ?Trouble relaxing 2 2 3   ?Restless 2 1 2   ?Easily annoyed or irritable 3 1 3   ?Afraid - awful might happen 3 1 2   ?Total GAD 7 Score 18 10 18   ?Anxiety Difficulty Very difficult Somewhat difficult Somewhat difficult  ? ? ? ? ?Labs: ?Lab Results  ?Component Value Date  ? WBC 6.0  05/09/2020  ? HGB 14.3 05/09/2020  ? HCT 42.3 05/09/2020  ? MCV 90 05/09/2020  ? PLT 274 05/09/2020  ? ? ?Lab Results  ?Component Value Date  ? CREATININE 0.60 05/09/2020  ? BUN 13 05/09/2020  ? NA 144 05/09/2020  ? K

## 2021-05-15 NOTE — Patient Instructions (Signed)
Breast Self-Awareness ?Breast self-awareness is knowing how your breasts look and feel. Doing breast self-awareness is important. It allows you to catch a breast problem early while it is still small and can be treated. All women should do breast self-awareness, including women who have had breast implants. Tell your doctor if you notice a change in your breasts. ?What you need: ?A mirror. ?A well-lit room. ?How to do a breast self-exam ?A breast self-exam is one way to learn what is normal for your breasts and to check for changes. To do a breast self-exam: ?Look for changes ? ?Take off all the clothes above your waist. ?Stand in front of a mirror in a room with good lighting. ?Put your hands on your hips. ?Push your hands down. ?Look at your breasts and nipples in the mirror to see if one breast or nipple looks different from the other. Check to see if: ?The shape of one breast is different. ?The size of one breast is different. ?There are wrinkles, dips, and bumps in one breast and not the other. ?Look at each breast for changes in the skin, such as: ?Redness. ?Scaly areas. ?Look for changes in your nipples, such as: ?Liquid around the nipples. ?Bleeding. ?Dimpling. ?Redness. ?A change in where the nipples are. ?Feel for changes ? ?Lie on your back on the floor. ?Feel each breast. To do this, follow these steps: ?Pick a breast to feel. ?Put the arm closest to that breast above your head. ?Use your other arm to feel the nipple area of your breast. Feel the area with the pads of your three middle fingers by making small circles with your fingers. For the first circle, press lightly. For the second circle, press harder. For the third circle, press even harder. ?Keep making circles with your fingers at the different pressures as you move down your breast. Stop when you feel your ribs. ?Move your fingers a little toward the center of your body. ?Start making circles with your fingers again, this time going up until  you reach your collarbone. ?Keep making up-and-down circles until you reach your armpit. Remember to keep using the three pressures. ?Feel the other breast in the same way. ?Sit or stand in the tub or shower. ?With soapy water on your skin, feel each breast the same way you did in step 2 when you were lying on the floor. ?Write down what you find ?Writing down what you find can help you remember what to tell your doctor. Write down: ?What is normal for each breast. ?Any changes you find in each breast, including: ?The kind of changes you find. ?Whether you have pain. ?Size and location of any lumps. ?When you last had your menstrual period. ?General tips ?Check your breasts every month. ?If you are breastfeeding, the best time to check your breasts is after you feed your baby or after you use a breast pump. ?If you get menstrual periods, the best time to check your breasts is 5-7 days after your menstrual period is over. ?With time, you will become comfortable with the self-exam, and you will begin to know if there are changes in your breasts. ?Contact a doctor if you: ?See a change in the shape or size of your breasts or nipples. ?See a change in the skin of your breast or nipples, such as red or scaly skin. ?Have fluid coming from your nipples that is not normal. ?Find a lump or thick area that was not there before. ?Have pain in   your breasts. ?Have any concerns about your breast health. ?Summary ?Breast self-awareness includes looking for changes in your breasts, as well as feeling for changes within your breasts. ?Breast self-awareness should be done in front of a mirror in a well-lit room. ?You should check your breasts every month. If you get menstrual periods, the best time to check your breasts is 5-7 days after your menstrual period is over. ?Let your doctor know of any changes you see in your breasts, including changes in size, changes on the skin, pain or tenderness, or fluid from your nipples that is not  normal. ?This information is not intended to replace advice given to you by your health care provider. Make sure you discuss any questions you have with your health care provider. ?Document Revised: 09/15/2017 Document Reviewed: 09/15/2017 ?Elsevier Patient Education ? 2022 [employer] ?Preventive Care 40-64 Years Old, Female ?Preventive care refers to lifestyle choices and visits with your health care provider that can promote health and wellness. Preventive care visits are also called wellness exams. ?What can I expect for my preventive care visit? ?Counseling ?Your health care provider may ask you questions about your: ?Medical history, including: ?Past medical problems. ?Family medical history. ?Pregnancy history. ?Current health, including: ?Menstrual cycle. ?Method of birth control. ?Emotional well-being. ?Home life and relationship well-being. ?Sexual activity and sexual health. ?Lifestyle, including: ?Alcohol, nicotine or tobacco, and drug use. ?Access to firearms. ?Diet, exercise, and sleep habits. ?Work and work environment. ?Sunscreen use. ?Safety issues such as seatbelt and bike helmet use. ?Physical exam ?Your health care provider will check your: ?Height and weight. These may be used to calculate your BMI (body mass index). BMI is a measurement that tells if you are at a healthy weight. ?Waist circumference. This measures the distance around your waistline. This measurement also tells if you are at a healthy weight and may help predict your risk of certain diseases, such as type 2 diabetes and high blood pressure. ?Heart rate and blood pressure. ?Body temperature. ?Skin for abnormal spots. ?What immunizations do I need? ?Vaccines are usually given at various ages, according to a schedule. Your health care provider will recommend vaccines for you based on your age, medical history, and lifestyle or other factors, such as travel or where you work. ?What tests do I need? ?Screening ?Your health care  provider may recommend screening tests for certain conditions. This may include: ?Lipid and cholesterol levels. ?Diabetes screening. This is done by checking your blood sugar (glucose) after you have not eaten for a while (fasting). ?Pelvic exam and Pap test. ?Hepatitis B test. ?Hepatitis C test. ?HIV (human immunodeficiency virus) test. ?STI (sexually transmitted infection) testing, if you are at risk. ?Lung cancer screening. ?Colorectal cancer screening. ?Mammogram. Talk with your health care provider about when you should start having regular mammograms. This may depend on whether you have a family history of breast cancer. ?BRCA-related cancer screening. This may be done if you have a family history of breast, ovarian, tubal, or peritoneal cancers. ?Bone density scan. This is done to screen for osteoporosis. ?Talk with your health care provider about your test results, treatment options, and if necessary, the need for more tests. ?Follow these instructions at home: ?Eating and drinking ? ?Eat a diet that includes fresh fruits and vegetables, whole grains, lean protein, and low-fat dairy products. ?Take vitamin and mineral supplements as recommended by your health care provider. ?Do not drink alcohol if: ?Your health care provider tells you not to drink. ?You are pregnant,   may be pregnant, or are planning to become pregnant. ?If you drink alcohol: ?Limit how much you have to 0-1 drink a day. ?Know how much alcohol is in your drink. In the [place] one drink equals one 12 oz bottle of beer (355 mL), one 5 oz glass of wine (148 mL), or one 1? oz glass of hard liquor (44 mL). ?Lifestyle ?Brush your teeth every morning and night with fluoride toothpaste. Floss one time each day. ?Exercise for at least 30 minutes 5 or more days each week. ?Do not use any products that contain nicotine or tobacco. These products include cigarettes, chewing tobacco, and vaping devices, such as e-cigarettes. If you need help quitting, ask  your health care provider. ?Do not use drugs. ?If you are sexually active, practice safe sex. Use a condom or other form of protection to prevent STIs. ?If you do not wish to become pregnant, use a form of

## 2021-07-04 ENCOUNTER — Encounter: Payer: No Typology Code available for payment source | Admitting: Obstetrics and Gynecology

## 2021-09-24 ENCOUNTER — Encounter: Payer: No Typology Code available for payment source | Admitting: Obstetrics and Gynecology

## 2021-10-05 IMAGING — MG MM DIGITAL SCREENING BILAT W/ TOMO AND CAD
8 series · 8 of 24 positions shown · non-contrast
Comparison: Previous exam(s).

CLINICAL DATA: Screening.

EXAM:
DIGITAL SCREENING BILATERAL MAMMOGRAM WITH TOMOSYNTHESIS AND CAD
TECHNIQUE: Bilateral screening digital craniocaudal and mediolateral oblique
mammograms were obtained. Bilateral screening digital breast
tomosynthesis was performed. The images were evaluated with
computer-aided detection.

[R MLO synth-2D]
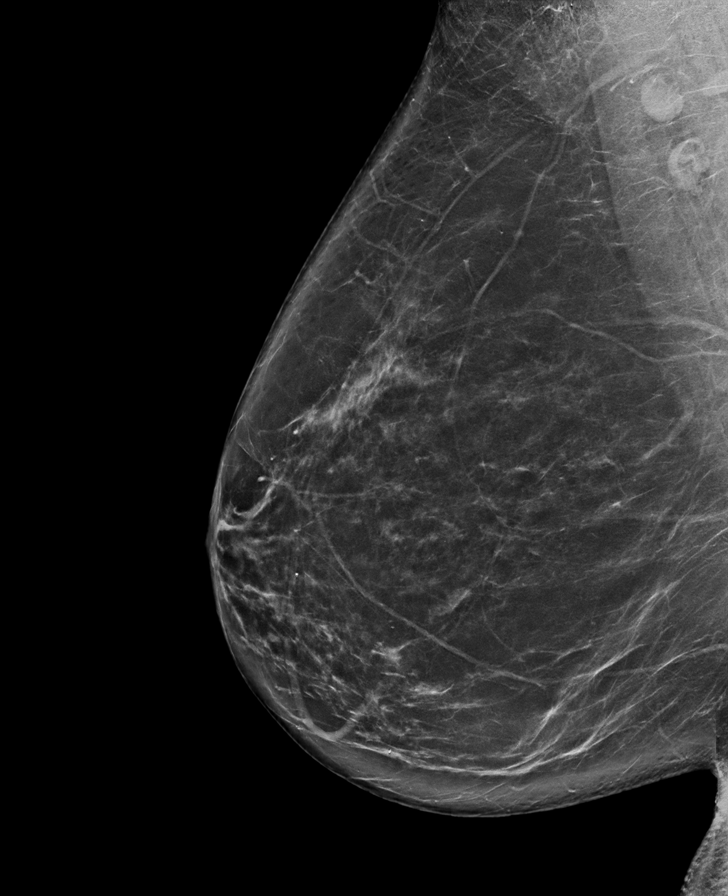

[R CC synth-2D]
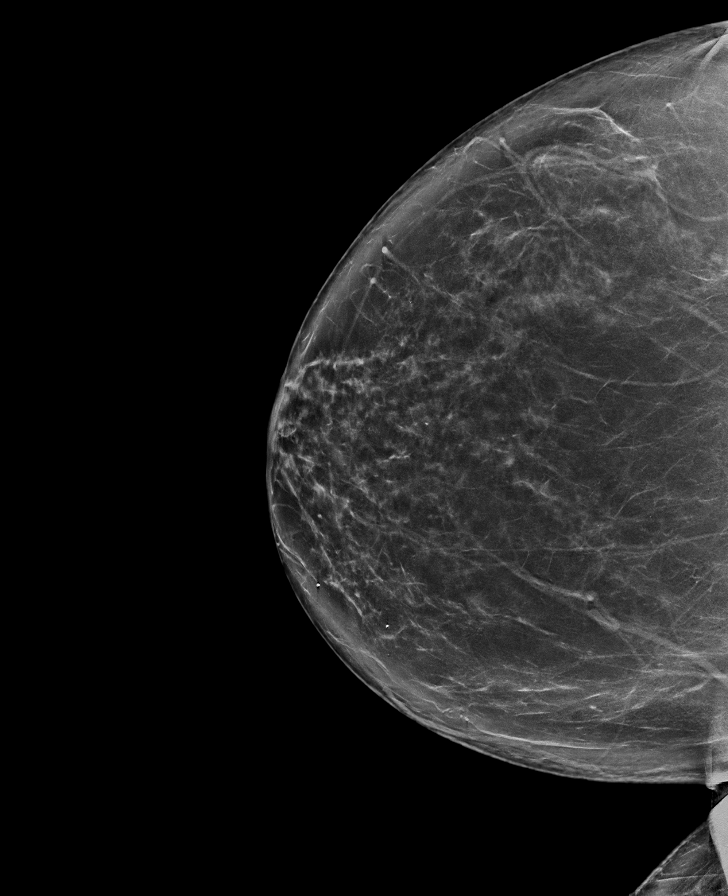

[L MLO synth-2D]
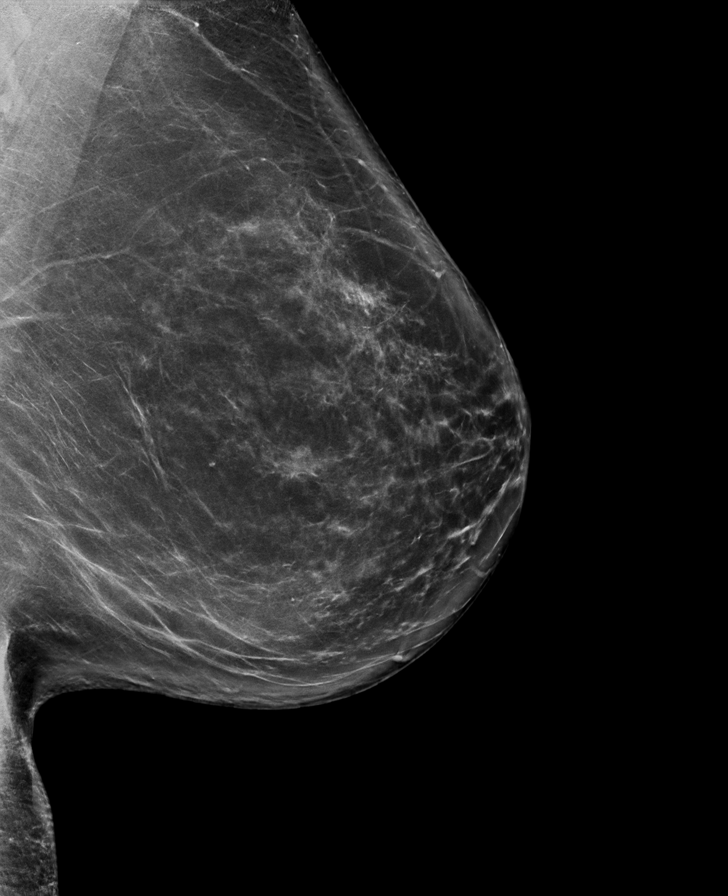

[L CC synth-2D]
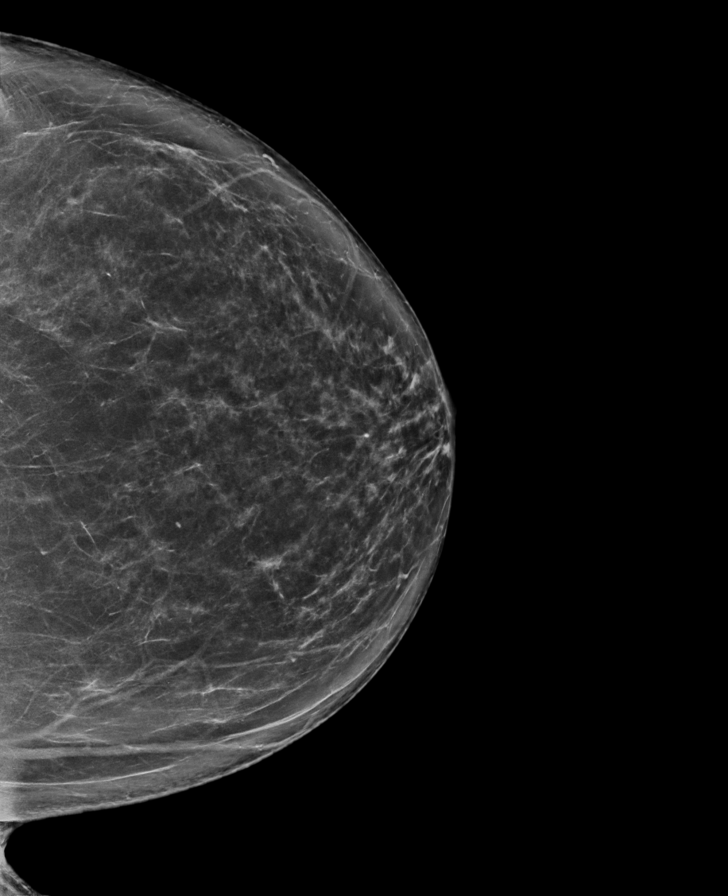

[L MLO tomo · tomo slice 50/99.0]
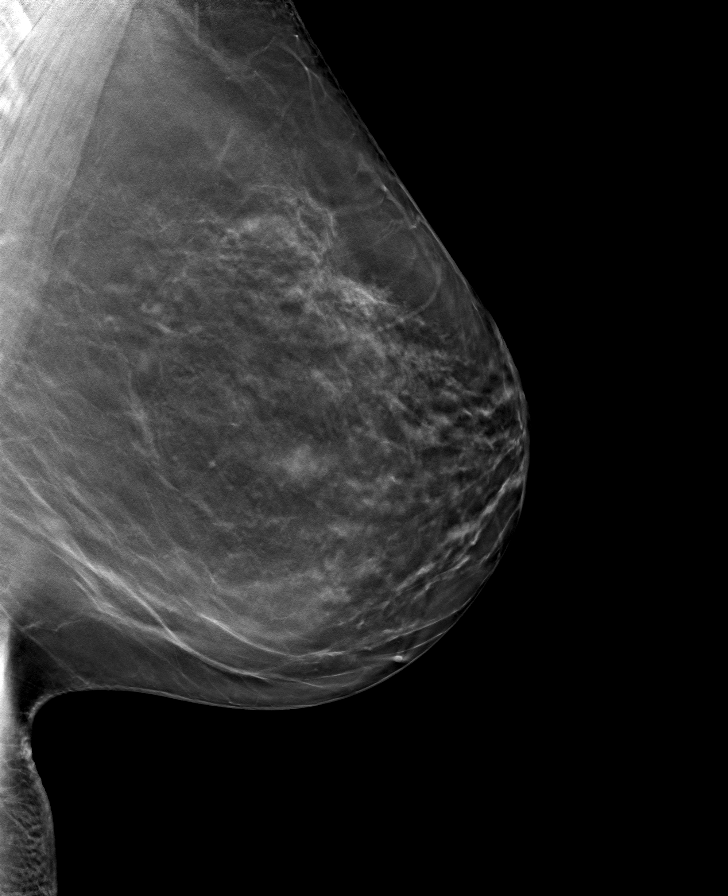

[R MLO tomo · tomo slice 47/94.0]
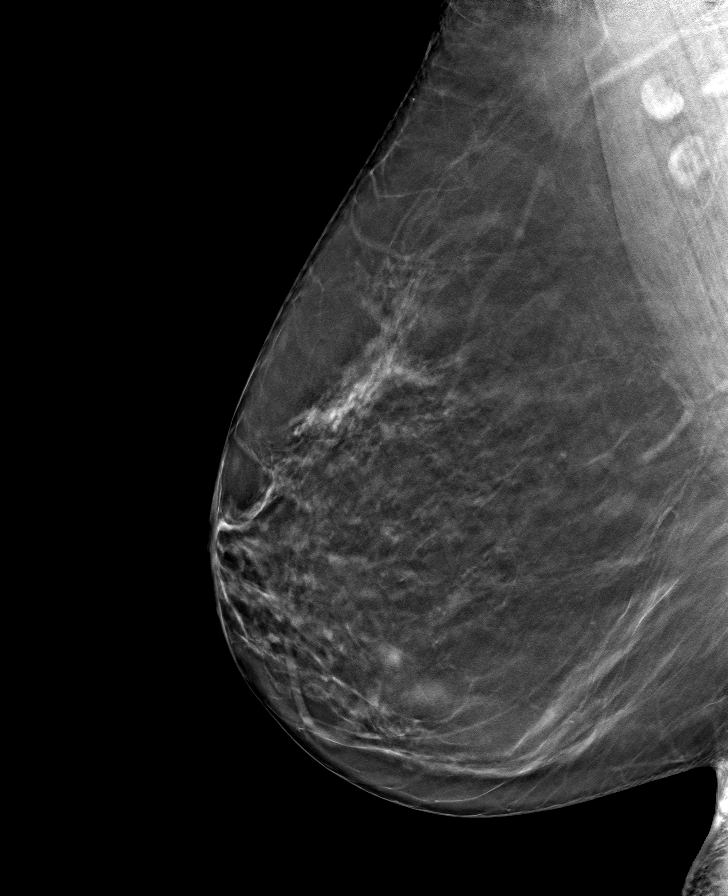

[R CC tomo · tomo slice 47/94.0]
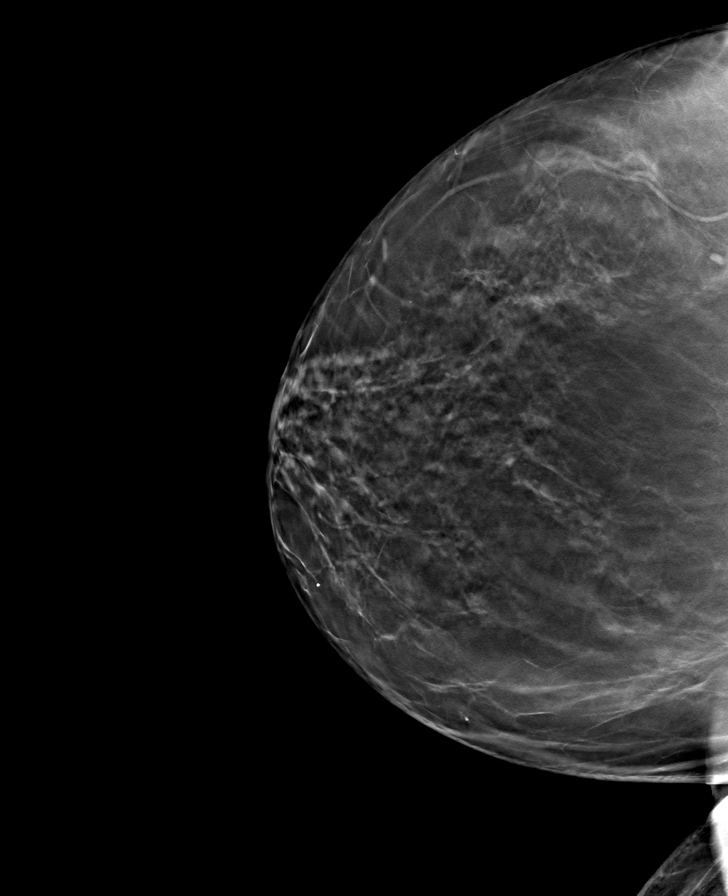

[L CC tomo · tomo slice 45/88.0]
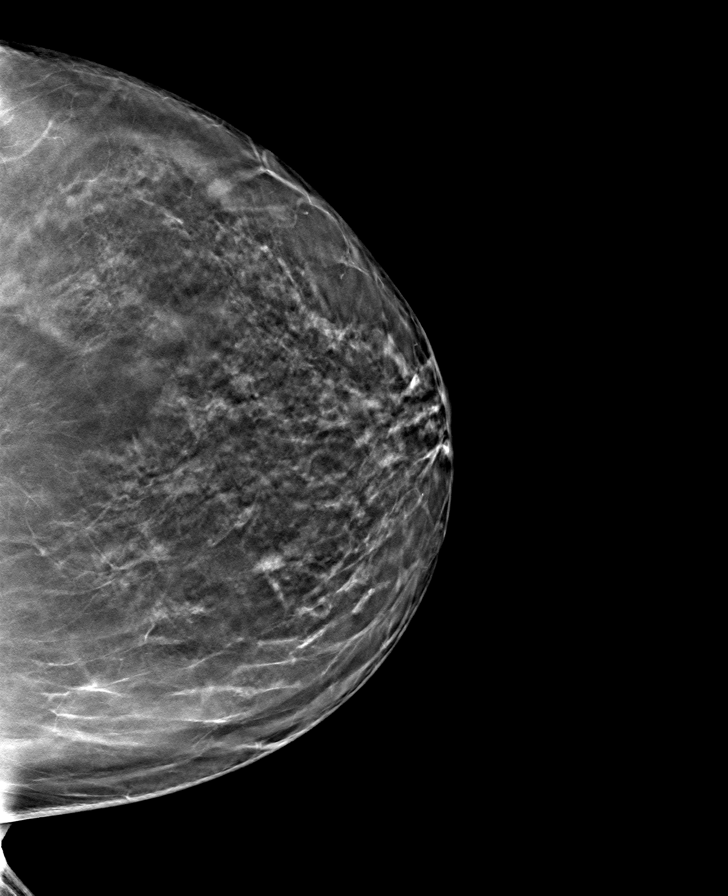

[8 of 24 positions shown; findings below may reference images not displayed]

ACR Breast Density Category b: There are scattered areas of
fibroglandular density.
FINDINGS: There are no findings suspicious for malignancy. The images were
evaluated with computer-aided detection.
IMPRESSION: No mammographic evidence of malignancy. A result letter of this
screening mammogram will be mailed directly to the patient.

RECOMMENDATION:
Screening mammogram in one year. (Code:[id number])

BI-RADS CATEGORY  1: Negative.

## 2021-10-18 ENCOUNTER — Encounter: Payer: Self-pay | Admitting: Obstetrics and Gynecology

## 2021-10-18 MED ORDER — FLUOXETINE HCL 20 MG PO CAPS: 20.0000 mg | ORAL_CAPSULE | Freq: Every day | ORAL | 3 refills | Status: DC

## 2021-12-27 ENCOUNTER — Encounter: Payer: Self-pay | Admitting: Obstetrics and Gynecology

## 2021-12-30 ENCOUNTER — Other Ambulatory Visit: Payer: Self-pay

## 2021-12-30 MED ORDER — FLUOXETINE HCL 20 MG PO CAPS: 20.0000 mg | ORAL_CAPSULE | Freq: Every day | ORAL | 3 refills | Status: DC

## 2021-12-30 MED ORDER — FLUOXETINE HCL 40 MG PO CAPS: 40.0000 mg | ORAL_CAPSULE | Freq: Every day | ORAL | 3 refills | Status: DC

## 2021-12-30 MED ORDER — FLUOXETINE HCL 20 MG PO CAPS: 40.0000 mg | ORAL_CAPSULE | Freq: Every day | ORAL | 3 refills | Status: DC

## 2022-02-16 ENCOUNTER — Ambulatory Visit
Admission: EM | Admit: 2022-02-16 | Discharge: 2022-02-16 | Disposition: A | Discharge status: Home / Self Care | Payer: Commercial Managed Care - PPO | Attending: Emergency Medicine | Admitting: Emergency Medicine

## 2022-02-16 DIAGNOSIS — J069 Acute upper respiratory infection, unspecified: Secondary | ICD-10-CM | POA: Diagnosis not present

## 2022-02-16 MED ORDER — IPRATROPIUM BROMIDE 0.03 % NA SOLN: 2.0000 | Freq: Two times a day (BID) | NASAL | 12 refills | Status: DC

## 2022-02-16 MED ORDER — PROMETHAZINE-DM 6.25-15 MG/5ML PO SYRP: 5.0000 mL | ORAL_SOLUTION | Freq: Four times a day (QID) | ORAL | 0 refills | Status: DC | PRN

## 2022-02-16 MED ORDER — BENZONATATE 100 MG PO CAPS: 100.0000 mg | ORAL_CAPSULE | Freq: Three times a day (TID) | ORAL | 0 refills | Status: DC

## 2022-02-16 MED ORDER — AMOXICILLIN-POT CLAVULANATE 875-125 MG PO TABS: 1.0000 | ORAL_TABLET | Freq: Two times a day (BID) | ORAL | 0 refills | Status: DC

## 2022-02-16 NOTE — ED Triage Notes (Signed)
Pt c/o possible sinus infection.  Pt has cough x5days and chest congestion.

## 2022-02-16 NOTE — Discharge Instructions (Signed)
Your symptoms today are most likely being caused by a virus and should steadily improve in time it can take up to 10 days before you truly start to see a turnaround however things will get better, if no improvement seen by Friday you may begin use of Augmentin to cover for bacteria  In the meantime you may use Tessalon every 8 hours to help calm your coughing  You may use cough syrup every 6 hours as needed for additional comfort, be mindful this may make you feel drowsy  Begin use of nasal spray every morning and every evening to help clear congestion from the sinuses    You can take Tylenol and/or Ibuprofen as needed for fever reduction and pain relief.   For cough: honey 1/2 to 1 teaspoon (you can dilute the honey in water or another fluid).  You can also use guaifenesin and dextromethorphan for cough. You can use a humidifier for chest congestion and cough.  If you don't have a humidifier, you can sit in the bathroom with the hot shower running.      For sore throat: try warm salt water gargles, cepacol lozenges, throat spray, warm tea or water with lemon/honey, popsicles or ice, or OTC cold relief medicine for throat discomfort.   For congestion: take a daily anti-histamine like Zyrtec, Claritin, and a oral decongestant, such as pseudoephedrine.  You can also use Flonase 1-2 sprays in each nostril daily.   It is important to stay hydrated: drink plenty of fluids (water, gatorade/powerade/pedialyte, juices, or teas) to keep your throat moisturized and help further relieve irritation/discomfort.

## 2022-02-16 NOTE — ED Provider Notes (Signed)
[facility]    CSN: [id number] Arrival date & time: 02/16/22  1234      History   Chief Complaint Chief Complaint  Patient presents with   Cough   Nasal Congestion    HPI Michelle Dunlap is a 57 y.o. female.   Patient presents for evaluation of nasal congestion, rhinorrhea and a productive cough for 5 days.  No known sick contacts.  Tolerating food and liquids.  Has attempted use of Alka-Seltzer cold and flu without relief.  Denies shortness of breath, wheezing, fever, chills, ear pain, sore throat.  Denies respiratory history, non-smoker.  Past Medical History:  Diagnosis Date   Depression    03/2000- stable on meds- 01/2014- fluoxetine rx effective 20mg  qd   History of UTI    Increased BMI    Perimenopausal    PMDD (premenstrual dysphoric disorder) 12/07/2014    Patient Active Problem List   Diagnosis Date Noted   Dyslipidemia 05/09/2020   Prediabetes 05/09/2020   Anxiety and depression 12/07/2014   Overweight (BMI 25.0-29.9) 12/07/2014    Past Surgical History:  Procedure Laterality Date   DILATION AND CURETTAGE OF UTERUS  1992   missed ab   TUBAL LIGATION  1998    OB History     Gravida  3   Para  2   Term  2   Preterm      AB  1   Living  2      SAB  1   IAB      Ectopic      Multiple      Live Births  2            Home Medications    Prior to Admission medications   Medication Sig Start Date End Date Taking? Authorizing Provider  Ascorbic Acid (VITAMIN C) 100 MG tablet Take 100 mg by mouth daily.   Yes [provider]  calcium carbonate (OS-CAL) 600 MG TABS tablet Take 600 mg by mouth 2 (two) times daily with a meal.   Yes [provider]  cholecalciferol (VITAMIN D) 1000 UNITS tablet Take 1,000 Units by mouth daily.   Yes [provider]  Ferrous Sulfate (IRON) 325 (65 Fe) MG TABS Take by mouth.   Yes [provider]  FLUoxetine (PROZAC) 40 MG capsule Take 1 capsule (40 mg  total) by mouth daily. 12/30/21  Yes Rubie Maid, MD  Magnesium Salicylate 409 MG TABS Take by mouth 2 (two) times daily.   Yes [provider]    Family History Family History  Problem Relation Age of Onset   Mental retardation Sister        passed away 109   Colon cancer Mother 6   Healthy Father    Diabetes Neg Hx    Heart disease Neg Hx    Breast cancer Neg Hx     Social History Social History   Tobacco Use   Smoking status: Never   Smokeless tobacco: Never  Vaping Use   Vaping Use: Never used  Substance Use Topics   Alcohol use: No   Drug use: No     Allergies   Patient has no known allergies.   Review of Systems Review of Systems  Constitutional: Negative.   HENT:  Positive for congestion and rhinorrhea. Negative for dental problem, drooling, ear discharge, ear pain, facial swelling, hearing loss, mouth sores, nosebleeds, postnasal drip, sinus pressure, sinus pain, sneezing, sore throat, tinnitus, trouble swallowing and  voice change.   Respiratory:  Positive for cough. Negative for apnea, choking, chest tightness, shortness of breath, wheezing and stridor.   Cardiovascular: Negative.   Gastrointestinal: Negative.   Skin: Negative.      Physical Exam Triage Vital Signs ED Triage Vitals  Enc Vitals Group     BP 02/16/22 1259 121/79     Pulse Rate 02/16/22 1259 93     Resp --      Temp 02/16/22 1259 98 F (36.7 C)     Temp Source 02/16/22 1259 Oral     SpO2 02/16/22 1259 95 %     Weight 02/16/22 1258 170 lb (77.1 kg)     Height 02/16/22 1258 5\' 3"  (1.6 m)     Head Circumference --      Peak Flow --      Pain Score 02/16/22 1258 0     Pain Loc --      Pain Edu? --      Excl. in GC? --    No data found.  Updated Vital Signs BP 121/79 (BP Location: Left Arm)   Pulse 93   Temp 98 F (36.7 C) (Oral)   Ht 5\' 3"  (1.6 m)   Wt 170 lb (77.1 kg)   LMP 01/11/2018   SpO2 95%   BMI 30.11 kg/m   Visual Acuity Right Eye Distance:   Left  Eye Distance:   Bilateral Distance:    Right Eye Near:   Left Eye Near:    Bilateral Near:     Physical Exam Constitutional:      Appearance: Normal appearance.  HENT:     Head: Normocephalic.     Right Ear: Tympanic membrane, ear canal and external ear normal.     Left Ear: Tympanic membrane, ear canal and external ear normal.     Nose: Nose normal.     Mouth/Throat:     Mouth: Mucous membranes are moist.     Pharynx: Oropharynx is clear.  Cardiovascular:     Rate and Rhythm: Normal rate and regular rhythm.     Pulses: Normal pulses.     Heart sounds: Normal heart sounds.  Pulmonary:     Effort: Pulmonary effort is normal.     Breath sounds: Normal breath sounds.  Skin:    General: Skin is warm and dry.  Neurological:     Mental Status: She is alert and oriented to person, place, and time. Mental status is at baseline.  Psychiatric:        Mood and Affect: Mood normal.        Behavior: Behavior normal.      UC Treatments / Results  Labs (all labs ordered are listed, but only abnormal results are displayed) Labs Reviewed - No data to display  EKG   Radiology No results found.  Procedures Procedures (including critical care time)  Medications Ordered in UC Medications - No data to display  Initial Impression / Assessment and Plan / UC Course  I have reviewed the triage vital signs and the nursing notes.  Pertinent labs & imaging results that were available during my care of the patient were reviewed by me and considered in my medical decision making (see chart for details).  Viral URI with cough  Patient is in no signs of distress nor toxic appearing.  Vital signs are stable.  Low suspicion for pneumonia, pneumothorax or bronchitis and therefore will defer imaging.  Viral testing deferred due to timeline of illness.  Prescribed Tessalon, Promethazine DM and ipratropium.  Watch wait antibiotic placed at pharmacy for day 10 of illness, Augmentin  prescribed.   May use additional over-the-counter medications as needed for supportive care.  May follow-up with urgent care as needed if symptoms persist or worsen.  Note given.   Final Clinical Impressions(s) / UC Diagnoses   Final diagnoses:  None   Discharge Instructions   None    ED Prescriptions   None    PDMP not reviewed this encounter.   Valinda Hoar, NP 02/16/22 1351

## 2022-12-31 ENCOUNTER — Other Ambulatory Visit: Payer: Self-pay | Admitting: Obstetrics and Gynecology

## 2022-12-31 NOTE — Telephone Encounter (Signed)
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  I have added her to the waitlist.

## 2022-12-31 NOTE — Telephone Encounter (Signed)
Patient is overdue for an annual exam. Please have patient scheduled, and then can refill until appointment.

## 2023-01-05 NOTE — Telephone Encounter (Signed)
It doesn't have to be in December.

## 2023-01-06 NOTE — Telephone Encounter (Signed)
This pt has been scheduled for her annual with Dr. Valentino Saxon on 02/12/2023.

## 2023-02-12 ENCOUNTER — Ambulatory Visit: Payer: Commercial Managed Care - PPO | Admitting: Obstetrics and Gynecology

## 2023-03-03 NOTE — Progress Notes (Deleted)
ANNUAL PREVENTATIVE CARE GYNECOLOGY  ENCOUNTER NOTE  Subjective:       Michelle Dunlap is a 58 y.o. [phone] female here for a routine annual gynecologic exam. he patient is sexually active. The patient has never taken hormone replacement therapy. Patient denies post-menopausal vaginal bleeding. The patient wears seatbelts: yes. The patient participates in regular exercise: no. Has the patient ever been transfused or tattooed?: no.   Current complaints: 1.  ***    Gynecologic History Patient's last menstrual period was 01/11/2018. Contraception: post menopausal status Last Pap: 05/04/2019. Results were: normal Last mammogram: 06/19/2020. Results were: normal Last Colonoscopy: 05/17/2013 Due in : 10 years Last Dexa Scan: never had one.    Obstetric History OB History  Gravida Para Term Preterm AB Living  3 2 2  1 2   SAB IAB Ectopic Multiple Live Births  1    2    # Outcome Date GA Lbr Len/2nd Weight Sex Type Anes PTL Lv  3 Term 1997   8 lb 14.4 oz (4.037 kg) M Vag-Spont   LIV  2 Term 1993   8 lb 14.4 oz (4.037 kg) M Vag-Spont   LIV  1 SAB 1992            Past Medical History:  Diagnosis Date   Depression    03/2000- stable on meds- 01/2014- fluoxetine rx effective 20mg  qd   History of UTI    Increased BMI    Perimenopausal    PMDD (premenstrual dysphoric disorder) 12/07/2014    Family History  Problem Relation Age of Onset   Mental retardation Sister        passed away 03-27-1991   Colon cancer Mother 63   Healthy Father    Diabetes Neg Hx    Heart disease Neg Hx    Breast cancer Neg Hx     Past Surgical History:  Procedure Laterality Date   DILATION AND CURETTAGE OF UTERUS  1992   missed ab   TUBAL LIGATION  1998    Social History   Socioeconomic History   Marital status: Married    Spouse name: Not on file   Number of children: Not on file   Years of education: Not on file   Highest education level: Not on file  Occupational History   Not on file   Tobacco Use   Smoking status: Never   Smokeless tobacco: Never  Vaping Use   Vaping status: Never Used  Substance and Sexual Activity   Alcohol use: No   Drug use: No   Sexual activity: Yes    Birth control/protection: Surgical  Other Topics Concern   Not on file  Social History Narrative   Not on file   Social Drivers of Health   Financial Resource Strain: Not on file  Food Insecurity: Not on file  Transportation Needs: Not on file  Physical Activity: Insufficiently Active (04/09/2017)   Exercise Vital Sign    Days of Exercise per Week: 3 days    Minutes of Exercise per Session: 30 min  Stress: Not on file  Social Connections: Not on file  Intimate Partner Violence: Not on file    Current Outpatient Medications on File Prior to Visit  Medication Sig Dispense Refill   amoxicillin-clavulanate (AUGMENTIN) 875-125 MG tablet Take 1 tablet by mouth every 12 (twelve) hours. 14 tablet 0   Ascorbic Acid (VITAMIN C) 100 MG tablet Take 100 mg by mouth daily.     benzonatate (TESSALON) 100  MG capsule Take 1 capsule (100 mg total) by mouth every 8 (eight) hours. 40 capsule 0   calcium carbonate (OS-CAL) 600 MG TABS tablet Take 600 mg by mouth 2 (two) times daily with a meal.     cholecalciferol (VITAMIN D) 1000 UNITS tablet Take 1,000 Units by mouth daily.     Ferrous Sulfate (IRON) 325 (65 Fe) MG TABS Take by mouth.     FLUoxetine (PROZAC) 40 MG capsule TAKE 1 CAPSULE (40 MG TOTAL) BY MOUTH DAILY. 90 capsule 0   ipratropium (ATROVENT) 0.03 % nasal spray Place 2 sprays into both nostrils every 12 (twelve) hours. 30 mL 12   Magnesium Salicylate 325 MG TABS Take by mouth 2 (two) times daily.     promethazine-dextromethorphan (PROMETHAZINE-DM) 6.25-15 MG/5ML syrup Take 5 mLs by mouth 4 (four) times daily as needed for cough. 118 mL 0   No current facility-administered medications on file prior to visit.    No Known Allergies    Review of Systems ROS Review of Systems - General  ROS: negative for - chills, fatigue, fever, hot flashes, night sweats, weight gain or weight loss Psychological ROS: negative for - anxiety, decreased libido, depression, mood swings, physical abuse or sexual abuse Ophthalmic ROS: negative for - blurry vision, eye pain or loss of vision ENT ROS: negative for - headaches, hearing change, visual changes or vocal changes Allergy and Immunology ROS: negative for - hives, itchy/watery eyes or seasonal allergies Hematological and Lymphatic ROS: negative for - bleeding problems, bruising, swollen lymph nodes or weight loss Endocrine ROS: negative for - galactorrhea, hair pattern changes, hot flashes, malaise/lethargy, mood swings, palpitations, polydipsia/polyuria, skin changes, temperature intolerance or unexpected weight changes Breast ROS: negative for - new or changing breast lumps or nipple discharge Respiratory ROS: negative for - cough or shortness of breath Cardiovascular ROS: negative for - chest pain, irregular heartbeat, palpitations or shortness of breath Gastrointestinal ROS: no abdominal pain, change in bowel habits, or black or bloody stools Genito-Urinary ROS: no dysuria, trouble voiding, or hematuria Musculoskeletal ROS: negative for - joint pain or joint stiffness Neurological ROS: negative for - bowel and bladder control changes Dermatological ROS: negative for rash and skin lesion changes   Objective:   LMP 01/11/2018  CONSTITUTIONAL: Well-developed, well-nourished female in no acute distress.  PSYCHIATRIC: Normal mood and affect. Normal behavior. Normal judgment and thought content. NEUROLGIC: Alert and oriented to person, place, and time. Normal muscle tone coordination. No cranial nerve deficit noted. HENT:  Normocephalic, atraumatic, External right and left ear normal. Oropharynx is clear and moist EYES: Conjunctivae and EOM are normal. Pupils are equal, round, and reactive to light. No scleral icterus.  NECK: Normal range of  motion, supple, no masses.  Normal thyroid.  SKIN: Skin is warm and dry. No rash noted. Not diaphoretic. No erythema. No pallor. CARDIOVASCULAR: Normal heart rate noted, regular rhythm, no murmur. RESPIRATORY: Clear to auscultation bilaterally. Effort and breath sounds normal, no problems with respiration noted. BREASTS: Symmetric in size. No masses, skin changes, nipple drainage, or lymphadenopathy. ABDOMEN: Soft, normal bowel sounds, no distention noted.  No tenderness, rebound or guarding.  BLADDER: Normal PELVIC:  Bladder {:311640}  Urethra: {:311719}  Vulva: {:311722}  Vagina: {:311643}  Cervix: {:311644}  Uterus: {:311718}  Adnexa: {:311645}  RV: {Blank multiple:19196::"External Exam NormaI","No Rectal Masses","Normal Sphincter tone"}  MUSCULOSKELETAL: Normal range of motion. No tenderness.  No cyanosis, clubbing, or edema.  2+ distal pulses. LYMPHATIC: No Axillary, Supraclavicular, or Inguinal Adenopathy.   Labs:  Lab Results  Component Value Date   WBC 6.0 05/09/2020   HGB 14.3 05/09/2020   HCT 42.3 05/09/2020   MCV 90 05/09/2020   PLT 274 05/09/2020    Lab Results  Component Value Date   CREATININE 0.60 05/09/2020   BUN 13 05/09/2020   NA 144 05/09/2020   K 4.8 05/09/2020   CL 105 05/09/2020   CO2 22 05/09/2020    Lab Results  Component Value Date   ALT 10 05/09/2020   AST 20 05/09/2020   ALKPHOS 96 05/09/2020   BILITOT 0.2 05/09/2020    Lab Results  Component Value Date   CHOL 207 (H) 05/09/2020   HDL 50 05/09/2020   LDLCALC 129 (H) 05/09/2020   TRIG 156 (H) 05/09/2020   CHOLHDL 4.1 05/09/2020    Lab Results  Component Value Date   TSH 2.250 05/04/2019    Lab Results  Component Value Date   HGBA1C 5.8 (H) 05/09/2020     Assessment:   No diagnosis found.   Plan:  Pap: {Blank multiple:19196::"Pap, Reflex if ASCUS","Pap Co Test","GC/CT NAAT","Not needed","Not done"} Mammogram: {Blank multiple:19196::"***","Ordered","Not Ordered","Not  Indicated"} Colon Screening:  {Blank multiple:19196::"***","Ordered","Not Ordered","Not Indicated"} Labs: {Blank multiple:19196::"Lipid 1","FBS","TSH","Hemoglobin A1C","Vit D Level""***"} Routine preventative health maintenance measures emphasized: {Blank multiple:19196::"Exercise/Diet/Weight control","Tobacco Warnings","Alcohol/Substance use risks","Stress Management","Peer Pressure Issues","Safe Sex"} Flu vaccine status: COVID Vaccination status: Return to Clinic - 1 Year   [facility] [place]  [facility]

## 2023-03-04 ENCOUNTER — Ambulatory Visit: Payer: Commercial Managed Care - PPO | Admitting: Obstetrics and Gynecology

## 2023-03-04 DIAGNOSIS — R7303 Prediabetes: Secondary | ICD-10-CM

## 2023-03-04 DIAGNOSIS — Z01419 Encounter for gynecological examination (general) (routine) without abnormal findings: Secondary | ICD-10-CM

## 2023-03-04 DIAGNOSIS — E663 Overweight: Secondary | ICD-10-CM

## 2023-03-04 DIAGNOSIS — Z124 Encounter for screening for malignant neoplasm of cervix: Secondary | ICD-10-CM

## 2023-03-04 DIAGNOSIS — Z1231 Encounter for screening mammogram for malignant neoplasm of breast: Secondary | ICD-10-CM

## 2023-03-04 DIAGNOSIS — Z1159 Encounter for screening for other viral diseases: Secondary | ICD-10-CM

## 2023-03-04 DIAGNOSIS — E559 Vitamin D deficiency, unspecified: Secondary | ICD-10-CM

## 2023-03-04 DIAGNOSIS — Z114 Encounter for screening for human immunodeficiency virus [HIV]: Secondary | ICD-10-CM

## 2023-03-04 DIAGNOSIS — E785 Hyperlipidemia, unspecified: Secondary | ICD-10-CM

## 2023-04-14 NOTE — Patient Instructions (Signed)

## 2023-04-14 NOTE — Progress Notes (Unsigned)
 ANNUAL PREVENTATIVE CARE GYNECOLOGY  ENCOUNTER NOTE  Subjective:       Michelle Dunlap is a 58 y.o. [phone] female here for a routine annual gynecologic exam. The patient is sexually active. The patient has never taken hormone replacement therapy. Patient denies post-menopausal vaginal bleeding. The patient wears seatbelts: yes. The patient participates in regular exercise: no. Has the patient ever been transfused or tattooed?: no.   Current complaints: 1.  ***    Gynecologic History Patient's last menstrual period was 01/11/2018. Contraception: post menopausal status Last Pap: 05/04/2019. Results were: normal Last mammogram: 06/19/2020. Results were: normal Last Colonoscopy: 05/27/2013 Last Dexa Scan:    Obstetric History OB History  Gravida Para Term Preterm AB Living  3 2 2  1 2   SAB IAB Ectopic Multiple Live Births  1    2    # Outcome Date GA Lbr Len/2nd Weight Sex Type Anes PTL Lv  3 Term 1997   8 lb 14.4 oz (4.037 kg) M Vag-Spont   LIV  2 Term 1993   8 lb 14.4 oz (4.037 kg) M Vag-Spont   LIV  1 SAB 1992            Past Medical History:  Diagnosis Date   Depression    03/2000- stable on meds- 01/2014- fluoxetine rx effective 20mg  qd   History of UTI    Increased BMI    Perimenopausal    PMDD (premenstrual dysphoric disorder) 12/07/2014    Family History  Problem Relation Age of Onset   Mental retardation Sister        passed away 04-22-91   Colon cancer Mother 58   Healthy Father    Diabetes Neg Hx    Heart disease Neg Hx    Breast cancer Neg Hx     Past Surgical History:  Procedure Laterality Date   DILATION AND CURETTAGE OF UTERUS  1992   missed ab   TUBAL LIGATION  1998    Social History   Socioeconomic History   Marital status: Married    Spouse name: Not on file   Number of children: Not on file   Years of education: Not on file   Highest education level: Not on file  Occupational History   Not on file  Tobacco Use   Smoking status:  Never   Smokeless tobacco: Never  Vaping Use   Vaping status: Never Used  Substance and Sexual Activity   Alcohol use: No   Drug use: No   Sexual activity: Yes    Birth control/protection: Surgical  Other Topics Concern   Not on file  Social History Narrative   Not on file   Social Drivers of Health   Financial Resource Strain: Not on file  Food Insecurity: Not on file  Transportation Needs: Not on file  Physical Activity: Insufficiently Active (04/09/2017)   Exercise Vital Sign    Days of Exercise per Week: 3 days    Minutes of Exercise per Session: 30 min  Stress: Not on file  Social Connections: Not on file  Intimate Partner Violence: Not on file    Current Outpatient Medications on File Prior to Visit  Medication Sig Dispense Refill   amoxicillin-clavulanate (AUGMENTIN) 875-125 MG tablet Take 1 tablet by mouth every 12 (twelve) hours. 14 tablet 0   Ascorbic Acid (VITAMIN C) 100 MG tablet Take 100 mg by mouth daily.     benzonatate (TESSALON) 100 MG capsule Take 1 capsule (100 mg total)  by mouth every 8 (eight) hours. 40 capsule 0   calcium carbonate (OS-CAL) 600 MG TABS tablet Take 600 mg by mouth 2 (two) times daily with a meal.     cholecalciferol (VITAMIN D) 1000 UNITS tablet Take 1,000 Units by mouth daily.     Ferrous Sulfate (IRON) 325 (65 Fe) MG TABS Take by mouth.     FLUoxetine (PROZAC) 40 MG capsule TAKE 1 CAPSULE (40 MG TOTAL) BY MOUTH DAILY. 90 capsule 0   ipratropium (ATROVENT) 0.03 % nasal spray Place 2 sprays into both nostrils every 12 (twelve) hours. 30 mL 12   Magnesium Salicylate 325 MG TABS Take by mouth 2 (two) times daily.     promethazine-dextromethorphan (PROMETHAZINE-DM) 6.25-15 MG/5ML syrup Take 5 mLs by mouth 4 (four) times daily as needed for cough. 118 mL 0   No current facility-administered medications on file prior to visit.    No Known Allergies    Review of Systems ROS Review of Systems - General ROS: negative for - chills,  fatigue, fever, hot flashes, night sweats, weight gain or weight loss Psychological ROS: negative for - anxiety, decreased libido, depression, mood swings, physical abuse or sexual abuse Ophthalmic ROS: negative for - blurry vision, eye pain or loss of vision ENT ROS: negative for - headaches, hearing change, visual changes or vocal changes Allergy and Immunology ROS: negative for - hives, itchy/watery eyes or seasonal allergies Hematological and Lymphatic ROS: negative for - bleeding problems, bruising, swollen lymph nodes or weight loss Endocrine ROS: negative for - galactorrhea, hair pattern changes, hot flashes, malaise/lethargy, mood swings, palpitations, polydipsia/polyuria, skin changes, temperature intolerance or unexpected weight changes Breast ROS: negative for - new or changing breast lumps or nipple discharge Respiratory ROS: negative for - cough or shortness of breath Cardiovascular ROS: negative for - chest pain, irregular heartbeat, palpitations or shortness of breath Gastrointestinal ROS: no abdominal pain, change in bowel habits, or black or bloody stools Genito-Urinary ROS: no dysuria, trouble voiding, or hematuria Musculoskeletal ROS: negative for - joint pain or joint stiffness Neurological ROS: negative for - bowel and bladder control changes Dermatological ROS: negative for rash and skin lesion changes   Objective:   LMP 01/11/2018  CONSTITUTIONAL: Well-developed, well-nourished female in no acute distress.  PSYCHIATRIC: Normal mood and affect. Normal behavior. Normal judgment and thought content. NEUROLGIC: Alert and oriented to person, place, and time. Normal muscle tone coordination. No cranial nerve deficit noted. HENT:  Normocephalic, atraumatic, External right and left ear normal. Oropharynx is clear and moist EYES: Conjunctivae and EOM are normal. Pupils are equal, round, and reactive to light. No scleral icterus.  NECK: Normal range of motion, supple, no masses.   Normal thyroid.  SKIN: Skin is warm and dry. No rash noted. Not diaphoretic. No erythema. No pallor. CARDIOVASCULAR: Normal heart rate noted, regular rhythm, no murmur. RESPIRATORY: Clear to auscultation bilaterally. Effort and breath sounds normal, no problems with respiration noted. BREASTS: Symmetric in size. No masses, skin changes, nipple drainage, or lymphadenopathy. ABDOMEN: Soft, normal bowel sounds, no distention noted.  No tenderness, rebound or guarding.  BLADDER: Normal PELVIC:  Bladder {:311640}  Urethra: {:311719}  Vulva: {:311722}  Vagina: {:311643}  Cervix: {:311644}  Uterus: {:311718}  Adnexa: {:311645}  RV: {Blank multiple:19196::"External Exam NormaI","No Rectal Masses","Normal Sphincter tone"}  MUSCULOSKELETAL: Normal range of motion. No tenderness.  No cyanosis, clubbing, or edema.  2+ distal pulses. LYMPHATIC: No Axillary, Supraclavicular, or Inguinal Adenopathy.   Labs: Lab Results  Component Value Date  WBC 6.0 05/09/2020   HGB 14.3 05/09/2020   HCT 42.3 05/09/2020   MCV 90 05/09/2020   PLT 274 05/09/2020    Lab Results  Component Value Date   CREATININE 0.60 05/09/2020   BUN 13 05/09/2020   NA 144 05/09/2020   K 4.8 05/09/2020   CL 105 05/09/2020   CO2 22 05/09/2020    Lab Results  Component Value Date   ALT 10 05/09/2020   AST 20 05/09/2020   ALKPHOS 96 05/09/2020   BILITOT 0.2 05/09/2020    Lab Results  Component Value Date   CHOL 207 (H) 05/09/2020   HDL 50 05/09/2020   LDLCALC 129 (H) 05/09/2020   TRIG 156 (H) 05/09/2020   CHOLHDL 4.1 05/09/2020    Lab Results  Component Value Date   TSH 2.250 05/04/2019    Lab Results  Component Value Date   HGBA1C 5.8 (H) 05/09/2020     Assessment:   No diagnosis found.   Plan:  Pap: {Blank multiple:19196::"Pap, Reflex if ASCUS","Pap Co Test","GC/CT NAAT","Not needed","Not done"} Mammogram: {Blank multiple:19196::"***","Ordered","Not Ordered","Not Indicated"} Colon Screening:   {Blank multiple:19196::"***","Ordered","Not Ordered","Not Indicated"} Labs: {Blank multiple:19196::"Lipid 1","FBS","TSH","Hemoglobin A1C","Vit D Level""***"} Routine preventative health maintenance measures emphasized: {Blank multiple:19196::"Exercise/Diet/Weight control","Tobacco Warnings","Alcohol/Substance use risks","Stress Management","Peer Pressure Issues","Safe Sex"} Flu vaccine status: COVID Vaccination status: Return to Clinic - 1 Year   [facility], [place] [facility]

## 2023-04-15 ENCOUNTER — Other Ambulatory Visit (HOSPITAL_COMMUNITY)
Admission: RE | Admit: 2023-04-15 | Discharge: 2023-04-15 | Disposition: A | Discharge status: Home / Self Care | Source: Ambulatory Visit | Attending: Obstetrics and Gynecology | Admitting: Obstetrics and Gynecology

## 2023-04-15 ENCOUNTER — Ambulatory Visit (INDEPENDENT_AMBULATORY_CARE_PROVIDER_SITE_OTHER): Payer: Commercial Managed Care - PPO | Admitting: Obstetrics and Gynecology

## 2023-04-15 ENCOUNTER — Other Ambulatory Visit: Payer: Self-pay | Admitting: Obstetrics and Gynecology

## 2023-04-15 ENCOUNTER — Encounter: Payer: Self-pay | Admitting: Obstetrics and Gynecology

## 2023-04-15 VITALS — BP 120/78 | HR 76 | Resp 16 | Ht 65.0 in | Wt 183.0 lb

## 2023-04-15 DIAGNOSIS — Z01419 Encounter for gynecological examination (general) (routine) without abnormal findings: Secondary | ICD-10-CM

## 2023-04-15 DIAGNOSIS — R7303 Prediabetes: Secondary | ICD-10-CM

## 2023-04-15 DIAGNOSIS — Z1159 Encounter for screening for other viral diseases: Secondary | ICD-10-CM

## 2023-04-15 DIAGNOSIS — Z124 Encounter for screening for malignant neoplasm of cervix: Secondary | ICD-10-CM | POA: Diagnosis present

## 2023-04-15 DIAGNOSIS — F4321 Adjustment disorder with depressed mood: Secondary | ICD-10-CM

## 2023-04-15 DIAGNOSIS — E663 Overweight: Secondary | ICD-10-CM

## 2023-04-15 DIAGNOSIS — E785 Hyperlipidemia, unspecified: Secondary | ICD-10-CM

## 2023-04-15 DIAGNOSIS — Z780 Asymptomatic menopausal state: Secondary | ICD-10-CM

## 2023-04-15 DIAGNOSIS — F419 Anxiety disorder, unspecified: Secondary | ICD-10-CM

## 2023-04-15 DIAGNOSIS — Z1211 Encounter for screening for malignant neoplasm of colon: Secondary | ICD-10-CM

## 2023-04-15 MED ORDER — BUPROPION HCL ER (XL) 150 MG PO TB24
150.0000 mg | ORAL_TABLET | Freq: Every day | ORAL | 3 refills | Status: AC
Start: 2023-04-15 — End: ?

## 2023-04-15 MED ORDER — HYDROXYZINE HCL 25 MG PO TABS: 25.0000 mg | ORAL_TABLET | Freq: Every day | ORAL | 0 refills | Status: DC

## 2023-04-15 NOTE — Addendum Note (Signed)
 Addended by: Tommie Raymond on: 04/15/2023 11:09 AM   Modules accepted: Orders

## 2023-04-15 NOTE — Addendum Note (Signed)
 Addended by: Tommie Raymond on: 04/15/2023 09:29 AM   Modules accepted: Orders

## 2023-04-15 NOTE — Addendum Note (Signed)
 Addended by: Loney Laurence on: 04/15/2023 09:31 AM   Modules accepted: Orders

## 2023-04-16 LAB — CBC
Hematocrit: 41.5 % (ref 34.0–46.6)
Hemoglobin: 13.2 g/dL (ref 11.1–15.9)
MCH: 29.8 pg (ref 26.6–33.0)
MCHC: 31.8 g/dL (ref 31.5–35.7)
MCV: 94 fL (ref 79–97)
Platelets: 249 10*3/uL (ref 150–450)
RBC: 4.43 x10E6/uL (ref 3.77–5.28)
RDW: 12.7 % (ref 11.7–15.4)
WBC: 5.9 10*3/uL (ref 3.4–10.8)

## 2023-04-16 LAB — LIPID PANEL
Chol/HDL Ratio: 4.3 ratio (ref 0.0–4.4)
Cholesterol, Total: 226 mg/dL — ABNORMAL HIGH (ref 100–199)
HDL: 53 mg/dL (ref >39)
LDL Chol Calc (NIH): 142 mg/dL — ABNORMAL HIGH (ref 0–99)
Triglycerides: 175 mg/dL — ABNORMAL HIGH (ref 0–149)
VLDL Cholesterol Cal: 31 mg/dL (ref 5–40)

## 2023-04-16 LAB — COMPREHENSIVE METABOLIC PANEL
ALT: 10 IU/L (ref 0–32)
AST: 19 IU/L (ref 0–40)
Albumin: 4.6 g/dL (ref 3.8–4.9)
Alkaline Phosphatase: 94 IU/L (ref 44–121)
BUN/Creatinine Ratio: 19 (ref 12–28)
BUN: 15 mg/dL (ref 8–27)
Bilirubin Total: 0.4 mg/dL (ref 0.0–1.2)
CO2: 23 mmol/L (ref 20–29)
Calcium: 9.4 mg/dL (ref 8.7–10.3)
Chloride: 105 mmol/L (ref 96–106)
Creatinine, Ser: 0.79 mg/dL (ref 0.57–1.00)
Globulin, Total: 2.1 g/dL (ref 1.5–4.5)
Glucose: 93 mg/dL (ref 70–99)
Potassium: 4.8 mmol/L (ref 3.5–5.2)
Sodium: 140 mmol/L (ref 134–144)
Total Protein: 6.7 g/dL (ref 6.0–8.5)
eGFR: 86 mL/min/{1.73_m2} (ref >59)

## 2023-04-16 LAB — TSH: TSH: 2.3 u[IU]/mL (ref 0.450–4.500)

## 2023-04-16 LAB — HEMOGLOBIN A1C
Est. average glucose Bld gHb Est-mCnc: 123 mg/dL
Hgb A1c MFr Bld: 5.9 % — ABNORMAL HIGH (ref 4.8–5.6)

## 2023-04-16 LAB — HEPATITIS C ANTIBODY: Hep C Virus Ab: NONREACTIVE

## 2023-04-20 ENCOUNTER — Encounter: Payer: Self-pay | Admitting: Obstetrics and Gynecology

## 2023-04-20 LAB — CYTOLOGY - PAP
Comment: NEGATIVE
Diagnosis: NEGATIVE
High risk HPV: NEGATIVE

## 2023-05-07 ENCOUNTER — Other Ambulatory Visit: Payer: Self-pay | Admitting: Obstetrics and Gynecology

## 2023-05-12 NOTE — Progress Notes (Unsigned)
 GYNECOLOGY PROGRESS NOTE Virtual Visit via Video Note  I connected with Michelle Dunlap on 05/13/23 at 11:15 AM EDT by a video enabled telemedicine application and verified that I am speaking with the correct person using two identifiers.  Location: Patient: Office Provider: Office   I discussed the limitations of evaluation and management by telemedicine and the availability of in person appointments. The patient expressed understanding and agreed to proceed.   Subjective:    Patient ID: Michelle Dunlap, female    DOB: 14-Mar-1965, 58 y.o.   MRN: [id number]  HPI  Patient is a 58 y.o. G25P2012 female who presents for 1 month follow up for mood check. Has been dealing with some residual grief and depression after the passing of her husband last year. Has been on Prozac for many years, however remained quite symptomatic. Was initiated on Wellbutrin and Atarax due to issues with sleep and overeating. Notes that she never started the Atarax, and after reading some of the side effects of Wellbutrin decided to discontinue after only 1 week of use.  States that after her annual last month and being told that her cholesterol was elevated and her prediabetes was worsening, she decided to make some lifestyle changes. Has lost 6 lbs by changing diet and exercising.  Notes that this has also helped to improve her sleep issues. Feels like the weather is also helping with her mood as she is able to get out and do more now.        05/13/2023   11:41 AM 05/15/2021    2:14 PM 05/09/2020   10:12 AM 05/04/2019    9:23 AM  Depression screen PHQ 2/9  Decreased Interest 1 3 2 2   Down, Depressed, Hopeless 2 3 3 2   PHQ - 2 Score 3 6 5 4   Altered sleeping 2 3 3 3   Tired, decreased energy 1 3 2 3   Change in appetite 0 3 3 3   Feeling bad or failure about yourself  1 3 3 2   Trouble concentrating 1 2 2 2   Moving slowly or fidgety/restless 2 3 1 1   Suicidal thoughts 0 3 1 1   PHQ-9 Score 10 26 20 19   Difficult  doing work/chores Somewhat difficult Very difficult Somewhat difficult Somewhat difficult       05/13/2023   11:41 AM 05/15/2021    2:15 PM 05/09/2020   10:13 AM 05/04/2019    9:21 AM  GAD 7 : Generalized Anxiety Score  Nervous, Anxious, on Edge 1 2 1 2   Control/stop worrying 0 3 2 3   Worry too much - different things 0 3 2 3   Trouble relaxing 1 2 2 3   Restless 1 2 1 2   Easily annoyed or irritable 1 3 1 3   Afraid - awful might happen 0 3 1 2   Total GAD 7 Score 4 18 10 18   Anxiety Difficulty Somewhat difficult Very difficult Somewhat difficult Somewhat difficult     The following portions of the patient's history were reviewed and updated as appropriate: allergies, current medications, past family history, past medical history, past social history, past surgical history, and problem list.  Review of Systems Pertinent items noted in HPI and remainder of comprehensive ROS otherwise negative.   Objective:   Height 5\' 5"  (1.651 m), weight 177 lb (80.3 kg), last menstrual period 01/11/2018. Body mass index is 29.45 kg/m. General appearance: alert and no distress HEENT: Normocephalic, atraumatic Psych: normal mood, affect, and speech.    Assessment:  1. Anxiety and depression   2. Dyslipidemia   3. Prediabetes      Plan:   1. Anxiety and depression with grief (Primary) - Improvement in GAD-7 and PHQ-9 scores noted on today's visit. Can continue on current regimen of Prozac, declines further utilization of other prescribed medications at this time.  Discussed other natural supplements that can offer additional therapeutic support including Ashwaganda, St. John's wart, valerian root, and Vitamin B complex.    2. Dyslipidemia - Working to improve with lifestyle modifications. Will f/u in 4-6 months.   3. Prediabetes - Working to improve with lifestyle modifications. Will f/u in 4-6 months.    I discussed the assessment and treatment plan with the patient. The patient was  provided an opportunity to ask questions and all were answered. The patient agreed with the plan and demonstrated an understanding of the instructions.   The patient was advised to call back or seek an in-person evaluation if the symptoms worsen or if the condition fails to improve as anticipated.  I provided 11 minutes of non-face-to-face time during this encounter.   Hildred Laser, MD [facility]

## 2023-05-13 ENCOUNTER — Encounter: Payer: Self-pay | Admitting: Obstetrics and Gynecology

## 2023-05-13 ENCOUNTER — Telehealth: Admitting: Obstetrics and Gynecology

## 2023-05-13 VITALS — Ht 65.0 in | Wt 177.0 lb

## 2023-05-13 DIAGNOSIS — F419 Anxiety disorder, unspecified: Secondary | ICD-10-CM

## 2023-05-13 DIAGNOSIS — F32A Depression, unspecified: Secondary | ICD-10-CM

## 2023-05-13 DIAGNOSIS — E785 Hyperlipidemia, unspecified: Secondary | ICD-10-CM | POA: Diagnosis not present

## 2023-05-13 DIAGNOSIS — R7303 Prediabetes: Secondary | ICD-10-CM

## 2023-05-22 LAB — COLOGUARD: COLOGUARD: NEGATIVE
